# Patient Record
Sex: Female | Born: 1955 | Race: White | Hispanic: No | Marital: Single | Smoking: Current every day smoker
Health system: Southern US, Community
[De-identification: ages and names within clinical notes are randomized; demographics above are authoritative.]

## PROBLEM LIST (undated history)

## (undated) DIAGNOSIS — F419 Anxiety disorder, unspecified: Secondary | ICD-10-CM

## (undated) DIAGNOSIS — E079 Disorder of thyroid, unspecified: Secondary | ICD-10-CM

## (undated) DIAGNOSIS — J45909 Unspecified asthma, uncomplicated: Secondary | ICD-10-CM

## (undated) DIAGNOSIS — C4491 Basal cell carcinoma of skin, unspecified: Secondary | ICD-10-CM

## (undated) DIAGNOSIS — R32 Unspecified urinary incontinence: Secondary | ICD-10-CM

## (undated) DIAGNOSIS — K5790 Diverticulosis of intestine, part unspecified, without perforation or abscess without bleeding: Secondary | ICD-10-CM

## (undated) HISTORY — DX: Unspecified asthma, uncomplicated: J45.909

## (undated) HISTORY — DX: Unspecified urinary incontinence: R32

## (undated) HISTORY — DX: Anxiety disorder, unspecified: F41.9

## (undated) HISTORY — PX: TUBAL LIGATION: SHX77

## (undated) HISTORY — DX: Diverticulosis of intestine, part unspecified, without perforation or abscess without bleeding: K57.90

## (undated) HISTORY — DX: Basal cell carcinoma of skin, unspecified: C44.91

## (undated) HISTORY — DX: Disorder of thyroid, unspecified: E07.9

## (undated) HISTORY — PX: TUMOR REMOVAL: SHX12

---

## 2006-03-03 ENCOUNTER — Ambulatory Visit: Payer: Self-pay | Admitting: *Deleted

## 2006-08-30 ENCOUNTER — Ambulatory Visit: Payer: Self-pay | Admitting: *Deleted

## 2007-03-29 ENCOUNTER — Emergency Department: Payer: Self-pay | Admitting: Emergency Medicine

## 2007-04-10 ENCOUNTER — Other Ambulatory Visit: Payer: Self-pay

## 2007-04-10 ENCOUNTER — Emergency Department: Payer: Self-pay | Admitting: Internal Medicine

## 2007-08-16 ENCOUNTER — Ambulatory Visit: Payer: Self-pay

## 2008-12-25 IMAGING — CR DG CHEST 2V
1 series · 2 of 2 positions shown · non-contrast
Comparison: none

REASON FOR EXAM: Dyspnea
COMMENTS:

PROCEDURE:     DXR - DXR CHEST PA (OR AP) AND LATERAL  - March 29, 2007  [DATE]
RESULT:     The lung fields are clear. No pneumonia, pneumothorax or pleural
effusion is seen. The chest is hyperexpanded compatible with a history of
COPD.

[Series 1: view not recorded · 0.17mm/px · 2 of 2 slices shown]
[im 1/2]
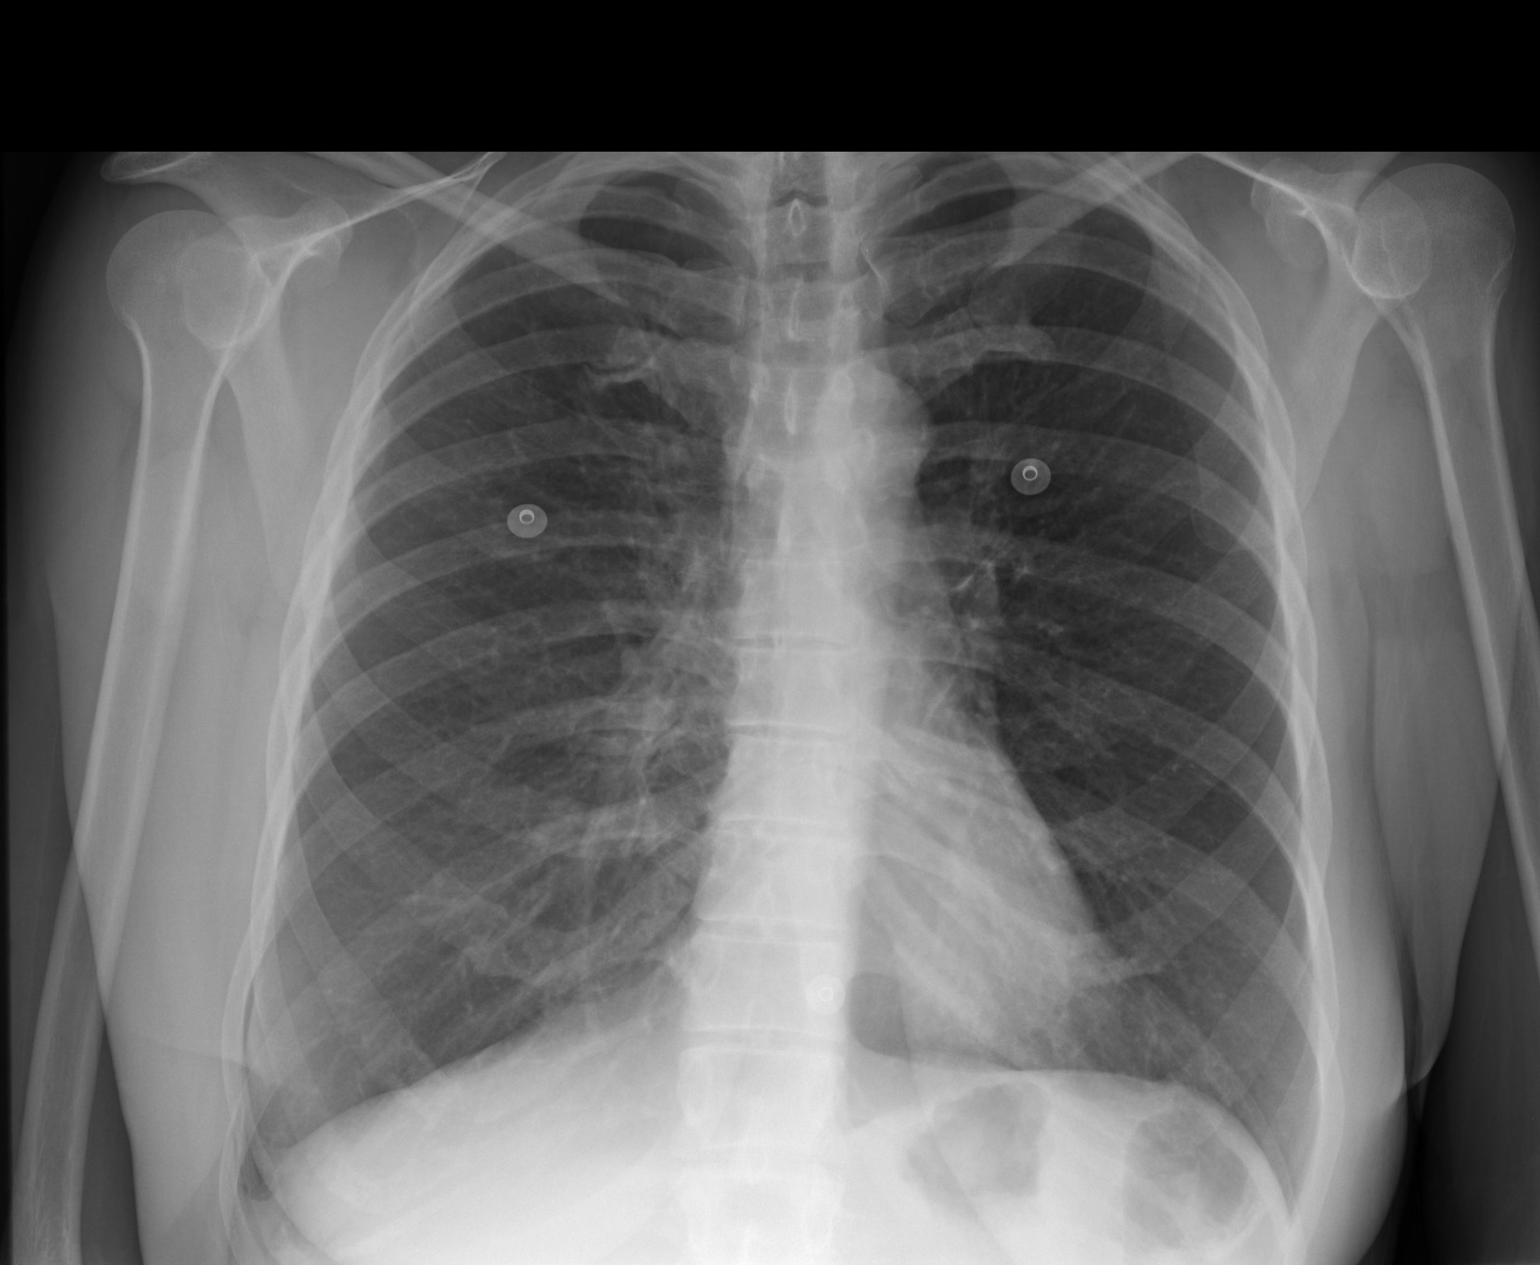
[im 2/2]
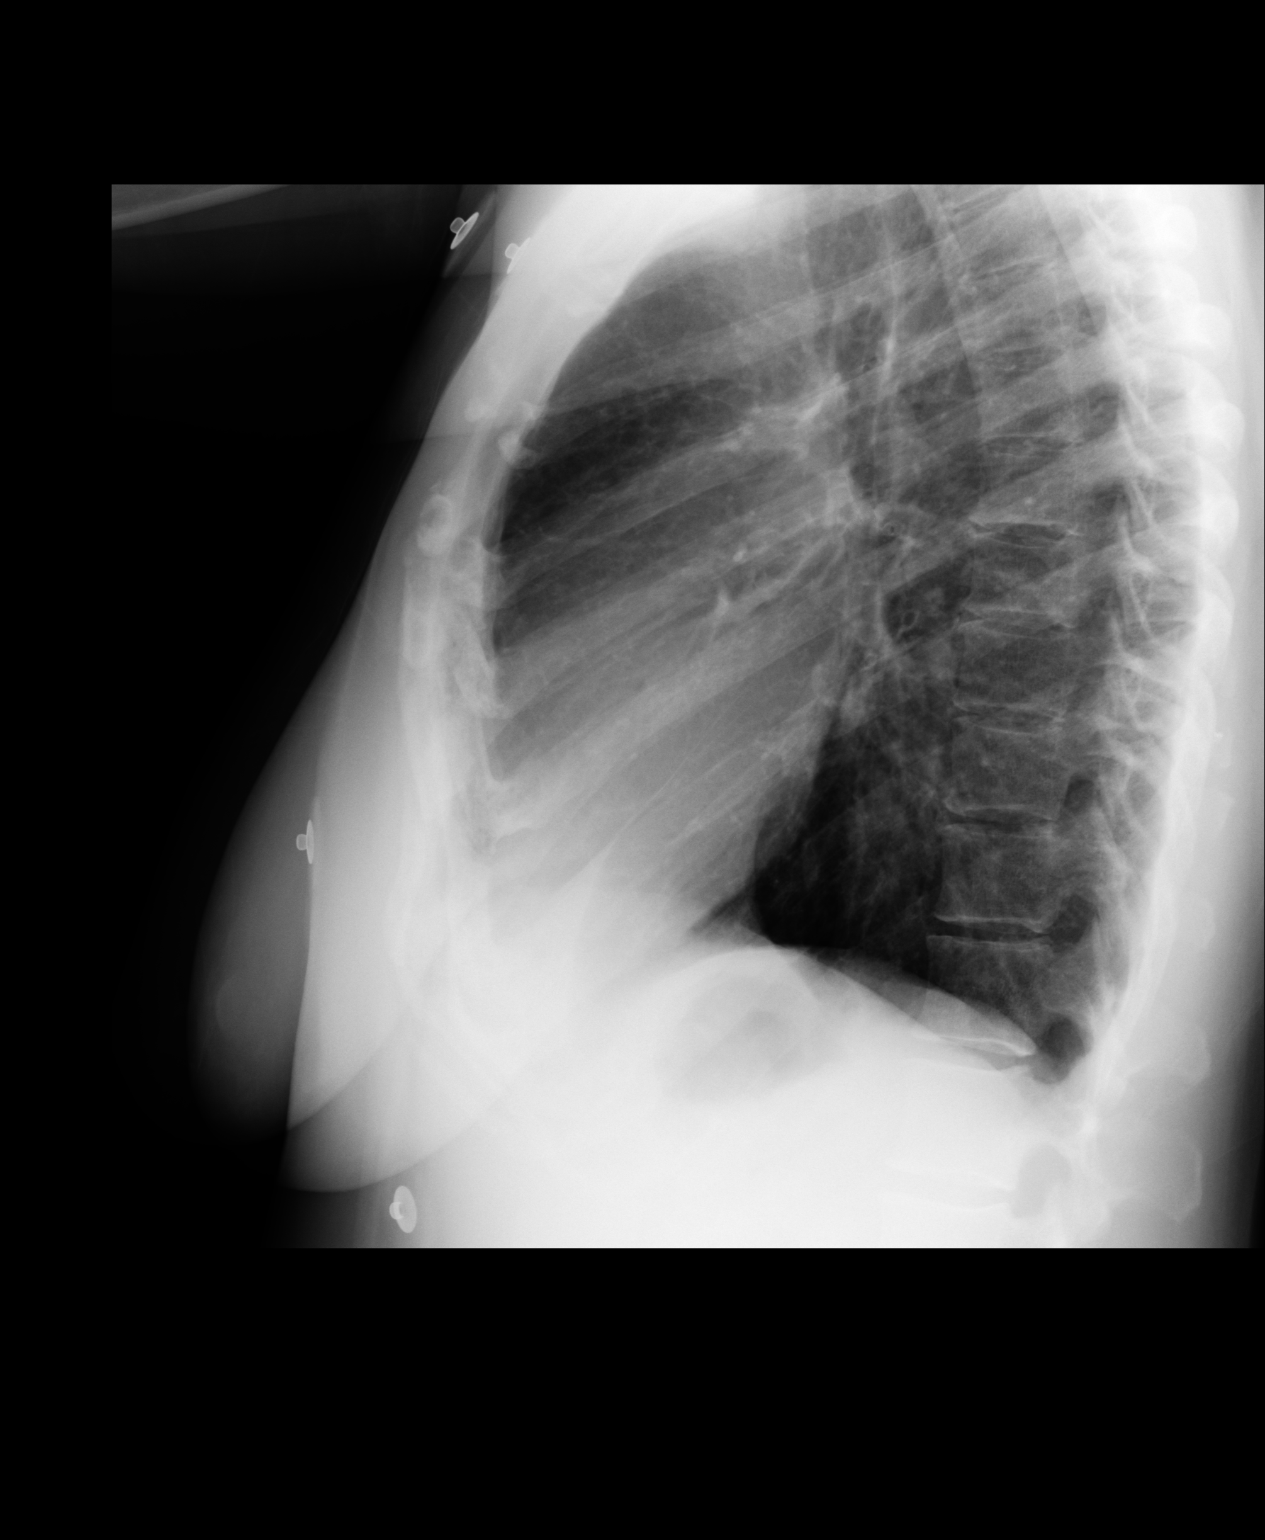

[2 of 2 positions shown; findings below may reference images not displayed]

IMPRESSION: 1.     The lung fields are clear.
2.     The chest is hyperexpanded consistent with a history of COPD.

## 2009-12-27 ENCOUNTER — Ambulatory Visit: Payer: Self-pay | Admitting: Family Medicine

## 2015-05-22 ENCOUNTER — Ambulatory Visit: Payer: Self-pay

## 2015-05-22 ENCOUNTER — Ambulatory Visit: Payer: Self-pay | Admitting: Internal Medicine

## 2015-05-22 DIAGNOSIS — J45909 Unspecified asthma, uncomplicated: Secondary | ICD-10-CM | POA: Insufficient documentation

## 2015-05-22 LAB — HEPATIC FUNCTION PANEL
ALK PHOS: 76 U/L (ref 25–125)
ALT: 17 U/L (ref 7–35)
AST: 18 U/L (ref 13–35)
BILIRUBIN, TOTAL: 0.3 mg/dL

## 2015-05-22 LAB — LIPID PANEL
Cholesterol: 179 mg/dL (ref 0–200)
HDL: 50 mg/dL (ref 35–70)
LDL Cholesterol: 97 mg/dL
TRIGLYCERIDES: 162 mg/dL — AB (ref 40–160)

## 2015-05-22 LAB — BASIC METABOLIC PANEL
BUN: 17 mg/dL (ref 4–21)
CREATININE: 0.9 mg/dL (ref 0.5–1.1)
POTASSIUM: 4.6 mmol/L (ref 3.4–5.3)
SODIUM: 137 mmol/L (ref 137–147)

## 2015-05-22 LAB — TSH: TSH: 3.26 u[IU]/mL (ref 0.41–5.90)

## 2015-06-11 DIAGNOSIS — Z720 Tobacco use: Secondary | ICD-10-CM | POA: Insufficient documentation

## 2015-06-12 ENCOUNTER — Other Ambulatory Visit: Payer: Self-pay

## 2015-06-12 LAB — CBC AND DIFFERENTIAL
HCT: 44 % (ref 36–46)
Hemoglobin: 14.4 g/dL (ref 12.0–16.0)
Neutrophils Absolute: 7 /uL
PLATELETS: 400 10*3/uL — AB (ref 150–399)
WBC: 12.1 10^3/mL

## 2015-06-12 LAB — BASIC METABOLIC PANEL: Glucose: 97 mg/dL

## 2015-06-12 LAB — HEMOGLOBIN A1C: HEMOGLOBIN A1C: 5.5

## 2015-06-26 ENCOUNTER — Ambulatory Visit: Payer: Self-pay | Admitting: Urology

## 2015-07-31 ENCOUNTER — Ambulatory Visit: Payer: Self-pay | Admitting: Urology

## 2015-12-10 DIAGNOSIS — J45909 Unspecified asthma, uncomplicated: Secondary | ICD-10-CM

## 2015-12-10 DIAGNOSIS — Z720 Tobacco use: Secondary | ICD-10-CM

## 2015-12-12 ENCOUNTER — Ambulatory Visit: Payer: Self-pay | Admitting: Nurse Practitioner

## 2015-12-12 VITALS — BP 118/70 | HR 95 | Temp 98.4°F | Ht 67.0 in | Wt 192.0 lb

## 2015-12-12 DIAGNOSIS — E782 Mixed hyperlipidemia: Secondary | ICD-10-CM

## 2015-12-12 DIAGNOSIS — D473 Essential (hemorrhagic) thrombocythemia: Secondary | ICD-10-CM

## 2015-12-12 DIAGNOSIS — D75839 Thrombocytosis, unspecified: Secondary | ICD-10-CM

## 2015-12-12 DIAGNOSIS — R7309 Other abnormal glucose: Secondary | ICD-10-CM

## 2015-12-12 DIAGNOSIS — K047 Periapical abscess without sinus: Secondary | ICD-10-CM

## 2015-12-12 DIAGNOSIS — R5383 Other fatigue: Secondary | ICD-10-CM

## 2015-12-12 MED ORDER — DOXYCYCLINE HYCLATE 50 MG PO CAPS
100.0000 mg | ORAL_CAPSULE | Freq: Two times a day (BID) | ORAL | Status: AC
Start: 2015-12-12 — End: 2015-12-26

## 2015-12-12 NOTE — Progress Notes (Signed)
   Subjective:    Patient ID: Sarah Weber, female    DOB: Mar 14, 1956, 60 y.o.   MRN: [id number]  HPI Here today to [facility], last seen in 05/2015, history of thrombocytosis, elevated wbc's, dental abscess, borderline diabetes. Today with very pain L lower jaw abscessed tooth and swollen jaw.    Has not been to clinic regularly because of transportation issues.    Has not been to a dentist since 1995.       Review of Systems  Constitutional: Negative.   HENT: Positive for dental problem. Negative for sinus pressure, tinnitus, trouble swallowing and voice change.   Eyes: Negative.   Respiratory: Positive for cough and wheezing.   Cardiovascular: Negative for chest pain and palpitations.  Gastrointestinal: Negative for diarrhea.  Genitourinary: Negative for pelvic pain.  Musculoskeletal: Negative for neck stiffness.       Objective:   Physical Exam  Constitutional: She is oriented to person, place, and time. She appears well-developed and well-nourished.  HENT:  Head: Normocephalic.  L lower molar with surrounding gum edema and erythema, swelling of L lower face/jaw  Eyes: Pupils are equal, round, and reactive to light.  Neck: No thyromegaly present.  Palpable submandibular adenopathy, L>R  Cardiovascular: Normal rate and regular rhythm.   Pulmonary/Chest: Effort normal. She has wheezes. She has no rales.  Lymphadenopathy:    She has no cervical adenopathy.  Neurological: She is alert and oriented to person, place, and time.  Skin: Skin is warm and dry.          Assessment & Plan:  Sarah Weber was seen today for dental pain.  Diagnoses and all orders for this visit:  Dental abscess  Mixed hyperlipidemia -     COMPLETE METABOLIC PANEL WITH GFR -     Lipid Profile  Blood glucose abnormal -     COMPLETE METABOLIC PANEL WITH GFR -     HgB A1c  Thrombocytosis (HCC) -     CBC w/Diff  Other fatigue -     COMPLETE METABOLIC PANEL WITH GFR -      TSH  Other orders -     doxycycline (VIBRAMYCIN) 50 MG capsule; Take 2 capsules (100 mg total) by mouth 2 (two) times daily.  will implement doxycycline for dental abscess for full 14 days Pt needs dental referral asap, likely will need extraction of L lower molar  Needs eye exam  Would like to return in 2-3 weeks to review all labs and address as indicated.

## 2015-12-13 LAB — CBC WITH DIFFERENTIAL/PLATELET
BASOS: 1 %
Basophils Absolute: 0.1 10*3/uL (ref 0.0–0.2)
EOS (ABSOLUTE): 0.1 10*3/uL (ref 0.0–0.4)
EOS: 1 %
HEMATOCRIT: 42.1 % (ref 34.0–46.6)
HEMOGLOBIN: 14.1 g/dL (ref 11.1–15.9)
Immature Grans (Abs): 0 10*3/uL (ref 0.0–0.1)
Immature Granulocytes: 0 %
LYMPHS ABS: 3.5 10*3/uL — AB (ref 0.7–3.1)
Lymphs: 33 %
MCH: 31.1 pg (ref 26.6–33.0)
MCHC: 33.5 g/dL (ref 31.5–35.7)
MCV: 93 fL (ref 79–97)
MONOS ABS: 0.7 10*3/uL (ref 0.1–0.9)
Monocytes: 7 %
NEUTROS PCT: 58 %
Neutrophils Absolute: 6.4 10*3/uL (ref 1.4–7.0)
Platelets: 388 10*3/uL — ABNORMAL HIGH (ref 150–379)
RBC: 4.54 x10E6/uL (ref 3.77–5.28)
RDW: 13.6 % (ref 12.3–15.4)
WBC: 10.8 10*3/uL (ref 3.4–10.8)

## 2015-12-13 LAB — LIPID PANEL
CHOL/HDL RATIO: 3.2 ratio (ref 0.0–4.4)
Cholesterol, Total: 178 mg/dL (ref 100–199)
HDL: 56 mg/dL (ref >39)
LDL CALC: 92 mg/dL (ref 0–99)
Triglycerides: 149 mg/dL (ref 0–149)
VLDL CHOLESTEROL CAL: 30 mg/dL (ref 5–40)

## 2015-12-13 LAB — TSH: TSH: 3.9 u[IU]/mL (ref 0.450–4.500)

## 2015-12-13 LAB — HEMOGLOBIN A1C
Est. average glucose Bld gHb Est-mCnc: 111 mg/dL
Hgb A1c MFr Bld: 5.5 % (ref 4.8–5.6)

## 2016-01-02 ENCOUNTER — Ambulatory Visit: Payer: Self-pay | Admitting: Urology

## 2016-01-02 VITALS — BP 114/72 | HR 76 | Resp 16 | Wt 194.0 lb

## 2016-01-02 DIAGNOSIS — Z720 Tobacco use: Secondary | ICD-10-CM

## 2016-01-02 DIAGNOSIS — J45909 Unspecified asthma, uncomplicated: Secondary | ICD-10-CM

## 2016-01-02 NOTE — Progress Notes (Signed)
  Patient: Sarah Weber Female    DOB: 1956/04/23   60 y.o.   MRN: [id number] Visit Date: 01/02/2016  Today's Provider: [facility]   No chief complaint on file.  Subjective:    HPI Patient is her to review labs.  Platelets slighty elevated, but down from last time.  Other labs are normal.  CMP not done.  Using an inhaler once daily.      Allergies  Allergen Reactions  . Amoxil [Amoxicillin] Itching    Mouth itching  . Erythromycin     Upset Stomach   Previous Medications   ALBUTEROL (VENTOLIN HFA) 108 (90 BASE) MCG/ACT INHALER    Inhale 1 puff into the lungs every 6 (six) hours as needed for wheezing or shortness of breath (Inhale 1 to 2 puffs every 6 hours as needed.).   CALCIUM CARBONATE ANTACID (ANTACID E-X PO)    Take by mouth as needed.    Review of Systems  Constitutional: Negative.   HENT: Negative.   Eyes: Negative.   Respiratory: Positive for cough.   Cardiovascular: Negative.   Gastrointestinal: Negative.   Endocrine: Negative.   Genitourinary: Negative.   Musculoskeletal: Negative.   Skin: Negative.   Allergic/Immunologic: Negative.   Neurological: Negative.   Hematological: Negative.   Psychiatric/Behavioral: Negative.     Social History  Substance Use Topics  . Smoking status: Current Every Day Smoker -- 1.00 packs/day for 40 years  . Smokeless tobacco: Not on file  . Alcohol Use: 3.6 oz/week    6 Standard drinks or equivalent per week   Objective:   BP 114/72 mmHg  Pulse 76  Resp 16  Wt 194 lb (87.998 kg)  Physical Exam  Constitutional: She is oriented to person, place, and time. She appears well-developed and well-nourished.  HENT:  Head: Normocephalic and atraumatic.  Eyes: Conjunctivae and EOM are normal. Pupils are equal, round, and reactive to light.  Neck: Normal range of motion.  Cardiovascular: Normal rate and regular rhythm.   Pulmonary/Chest: Effort normal and breath sounds normal.  Abdominal: Soft. Bowel sounds are  normal.  Musculoskeletal: Normal range of motion.  Neurological: She is alert and oriented to person, place, and time.  Skin: Skin is warm and dry.  Psychiatric: She has a normal mood and affect. Her behavior is normal. Judgment and thought content normal.        Assessment & Plan:     Dental abscess  -patient will be seen at the dental clinic  Mixed hyperlipidemia - Lipid Profile-normal  Blood glucose abnormal - COMPLETE METABOLIC PANEL WITH GFR-not done - HgB A1c-normal  Thrombocytosis (HCC) - CBC w/Diff-wnl  Other fatigue - COMPLETE METABOLIC PANEL WITH GFR-not done - TSH-normal      [facility]

## 2016-06-05 ENCOUNTER — Other Ambulatory Visit: Payer: Self-pay | Admitting: Internal Medicine

## 2016-06-05 DIAGNOSIS — J45909 Unspecified asthma, uncomplicated: Secondary | ICD-10-CM

## 2016-08-06 ENCOUNTER — Telehealth: Payer: Self-pay | Admitting: Pharmacist

## 2016-08-06 NOTE — Telephone Encounter (Signed)
Ventolin PAP submitted to manufacturer today.

## 2016-08-18 ENCOUNTER — Telehealth: Payer: Self-pay

## 2016-08-18 NOTE — Telephone Encounter (Signed)
PT called in with complaint of a abscess on her tooth. She wanted some antibiotics. I explained we had nothing available this week but I would try and get her in to the dental clinic because she has already paid the $20. I did get her in her appt is at 1030. Pt verbalized understanding and will be there.

## 2016-12-17 ENCOUNTER — Telehealth: Payer: Self-pay | Admitting: Pharmacist

## 2016-12-17 NOTE — Telephone Encounter (Signed)
12/17/16 Placed refill online with [facility] for Ventolin HFA, to ship 12/30/16.

## 2018-01-03 ENCOUNTER — Ambulatory Visit
Admission: RE | Admit: 2018-01-03 | Discharge: 2018-01-03 | Disposition: A | Discharge status: Home / Self Care | Payer: Self-pay | Source: Ambulatory Visit | Attending: Oncology | Admitting: Oncology

## 2018-01-03 ENCOUNTER — Encounter (INDEPENDENT_AMBULATORY_CARE_PROVIDER_SITE_OTHER): Payer: Self-pay

## 2018-01-03 ENCOUNTER — Ambulatory Visit: Payer: Self-pay | Attending: Oncology | Admitting: *Deleted

## 2018-01-03 VITALS — BP 138/83 | HR 77 | Temp 97.7°F | Ht 68.0 in | Wt 192.0 lb

## 2018-01-03 DIAGNOSIS — Z0000 Encounter for general adult medical examination without abnormal findings: Secondary | ICD-10-CM

## 2018-01-03 NOTE — Patient Instructions (Signed)
Gave patient hand-out, Women Staying Healthy, Active and Well from [facility], with education on breast health, pap smears, heart and colon health. 

## 2018-01-03 NOTE — Progress Notes (Signed)
  Subjective:     Patient ID: Sarah Weber, female   DOB: June 29, 1956, 62 y.o.   MRN: [id number]  HPI   Review of Systems     Objective:   Physical Exam  Pulmonary/Chest: Right breast exhibits tenderness. Right breast exhibits no inverted nipple, no mass, no nipple discharge and no skin change. Left breast exhibits no inverted nipple, no mass, no nipple discharge, no skin change and no tenderness.         Assessment:     62 year old White female returns to [facility] for clinical breast exam and mammogram.  Refuses pap smear, although last pap was in 2009.  Stressed importance of screening.  Patient states she is here because she felt a mass in her right axilla in January.  States she has a history of "lipoma's" and thinks that is what she is feeling.  On clinical breast exam there is no dominant mass, skin changes, nipple discharge or lymphadenopathy.  The patient points to the area of concern and I still cannot palpate a mass.  With the patient in supine position I can palpate a slight increase in tissue in the right axilla, but no mass like area.  Taught patient self breast awareness.  Discussed option of diagnostic work-up for her concerns, or return in 2 months for a repeat clinical breast exam.  States she would go forward with a screening mammogram and return in 2 months for another clinical breast exam.  Patient has been screened for eligibility.  She does not have any insurance, Medicare or Medicaid.  She also meets financial eligibility.  Hand-out given on the Affordable Care Act.    Plan:     Screening mammogram ordered.  Jeanella Anton to schedule patient to return in 2 months for repeat clinical breast exam.  Will follow-up per [facility] protocol.

## 2018-01-04 ENCOUNTER — Encounter: Payer: Self-pay | Admitting: *Deleted

## 2018-01-04 NOTE — Progress Notes (Signed)
Letter mailed from the [facility] to inform patient of her normal mammogram results.  Patient is to follow-up with annual screening in one year.  HSIS to [place].  Patient is scheduled to return for repeat clinical breast exam on 02/21/18 @ 11:00.

## 2018-01-10 ENCOUNTER — Emergency Department
Admission: EM | Admit: 2018-01-10 | Discharge: 2018-01-10 | Disposition: A | Discharge status: Home / Self Care | Payer: Self-pay | Attending: Emergency Medicine | Admitting: Emergency Medicine

## 2018-01-10 ENCOUNTER — Other Ambulatory Visit: Payer: Self-pay

## 2018-01-10 ENCOUNTER — Emergency Department: Payer: Self-pay

## 2018-01-10 DIAGNOSIS — F17210 Nicotine dependence, cigarettes, uncomplicated: Secondary | ICD-10-CM | POA: Insufficient documentation

## 2018-01-10 DIAGNOSIS — R531 Weakness: Secondary | ICD-10-CM | POA: Insufficient documentation

## 2018-01-10 DIAGNOSIS — J45909 Unspecified asthma, uncomplicated: Secondary | ICD-10-CM

## 2018-01-10 DIAGNOSIS — R55 Syncope and collapse: Secondary | ICD-10-CM

## 2018-01-10 DIAGNOSIS — R42 Dizziness and giddiness: Secondary | ICD-10-CM

## 2018-01-10 LAB — URINALYSIS, COMPLETE (UACMP) WITH MICROSCOPIC
BACTERIA UA: NONE SEEN
BILIRUBIN URINE: NEGATIVE
GLUCOSE, UA: NEGATIVE mg/dL
Ketones, ur: NEGATIVE mg/dL
LEUKOCYTES UA: NEGATIVE
NITRITE: NEGATIVE
PH: 6 (ref 5.0–8.0)
Protein, ur: NEGATIVE mg/dL
SPECIFIC GRAVITY, URINE: 1.003 — AB (ref 1.005–1.030)
WBC UA: NONE SEEN WBC/hpf (ref 0–5)

## 2018-01-10 LAB — CBC WITH DIFFERENTIAL/PLATELET
BASOS ABS: 0.1 10*3/uL (ref 0–0.1)
Basophils Relative: 1 %
EOS ABS: 0.1 10*3/uL (ref 0–0.7)
Eosinophils Relative: 1 %
HCT: 44.2 % (ref 35.0–47.0)
HEMOGLOBIN: 15.2 g/dL (ref 12.0–16.0)
Lymphocytes Relative: 23 %
Lymphs Abs: 2.4 10*3/uL (ref 1.0–3.6)
MCH: 32.4 pg (ref 26.0–34.0)
MCHC: 34.4 g/dL (ref 32.0–36.0)
MCV: 94.2 fL (ref 80.0–100.0)
Monocytes Absolute: 0.8 10*3/uL (ref 0.2–0.9)
Monocytes Relative: 8 %
NEUTROS PCT: 67 %
Neutro Abs: 6.9 10*3/uL — ABNORMAL HIGH (ref 1.4–6.5)
PLATELETS: 325 10*3/uL (ref 150–440)
RBC: 4.69 MIL/uL (ref 3.80–5.20)
RDW: 13.1 % (ref 11.5–14.5)
WBC: 10.4 10*3/uL (ref 3.6–11.0)

## 2018-01-10 LAB — COMPREHENSIVE METABOLIC PANEL
ALK PHOS: 58 U/L (ref 38–126)
ALT: 26 U/L (ref 14–54)
ANION GAP: 7 (ref 5–15)
AST: 23 U/L (ref 15–41)
Albumin: 4.2 g/dL (ref 3.5–5.0)
BUN: 13 mg/dL (ref 6–20)
CALCIUM: 9.1 mg/dL (ref 8.9–10.3)
CHLORIDE: 101 mmol/L (ref 101–111)
CO2: 25 mmol/L (ref 22–32)
Creatinine, Ser: 0.81 mg/dL (ref 0.44–1.00)
Glucose, Bld: 121 mg/dL — ABNORMAL HIGH (ref 65–99)
Potassium: 4.1 mmol/L (ref 3.5–5.1)
SODIUM: 133 mmol/L — AB (ref 135–145)
Total Bilirubin: 0.4 mg/dL (ref 0.3–1.2)
Total Protein: 7.9 g/dL (ref 6.5–8.1)

## 2018-01-10 LAB — TROPONIN I

## 2018-01-10 LAB — TSH: TSH: 3.071 u[IU]/mL (ref 0.350–4.500)

## 2018-01-10 LAB — CK: CK TOTAL: 79 U/L (ref 38–234)

## 2018-01-10 LAB — T4, FREE: Free T4: 0.74 ng/dL — ABNORMAL LOW (ref 0.82–1.77)

## 2018-01-10 MED ORDER — ALBUTEROL SULFATE HFA 108 (90 BASE) MCG/ACT IN AERS: 2.0000 | INHALATION_SPRAY | Freq: Four times a day (QID) | RESPIRATORY_TRACT | 0 refills | Status: DC | PRN

## 2018-01-10 MED ORDER — GADOBENATE DIMEGLUMINE 529 MG/ML IV SOLN
20.0000 mL | Freq: Once | INTRAVENOUS | Status: AC | PRN
  Administered 2018-01-10: 18 mL via INTRAVENOUS

## 2018-01-10 NOTE — Discharge Instructions (Signed)
It was a pleasure to take care of you today, and thank you for coming to our emergency department.  If you have any questions or concerns before leaving please ask the nurse to grab me and I'm more than happy to go through your aftercare instructions again.  If you were prescribed any opioid pain medication today such as Norco, Vicodin, Percocet, morphine, hydrocodone, or oxycodone please make sure you do not drive when you are taking this medication as it can alter your ability to drive safely.  If you have any concerns once you are home that you are not improving or are in fact getting worse before you can make it to your follow-up appointment, please do not hesitate to call 911 and come back for further evaluation.  Darel Hong, MD  Results for orders placed or performed during the hospital encounter of 01/10/18  Comprehensive metabolic panel  Result Value Ref Range   Sodium 133 (L) 135 - 145 mmol/L   Potassium 4.1 3.5 - 5.1 mmol/L   Chloride 101 101 - 111 mmol/L   CO2 25 22 - 32 mmol/L   Glucose, Bld 121 (H) 65 - 99 mg/dL   BUN 13 6 - 20 mg/dL   Creatinine, Ser 0.81 0.44 - 1.00 mg/dL   Calcium 9.1 8.9 - 10.3 mg/dL   Total Protein 7.9 6.5 - 8.1 g/dL   Albumin 4.2 3.5 - 5.0 g/dL   AST 23 15 - 41 U/L   ALT 26 14 - 54 U/L   Alkaline Phosphatase 58 38 - 126 U/L   Total Bilirubin 0.4 0.3 - 1.2 mg/dL   GFR calc non Af Amer >60 >60 mL/min   GFR calc Af Amer >60 >60 mL/min   Anion gap 7 5 - 15  Troponin I  Result Value Ref Range   Troponin I <0.03 <0.03 ng/mL  CBC with Differential  Result Value Ref Range   WBC 10.4 3.6 - 11.0 K/uL   RBC 4.69 3.80 - 5.20 MIL/uL   Hemoglobin 15.2 12.0 - 16.0 g/dL   HCT 44.2 35.0 - 47.0 %   MCV 94.2 80.0 - 100.0 fL   MCH 32.4 26.0 - 34.0 pg   MCHC 34.4 32.0 - 36.0 g/dL   RDW 13.1 11.5 - 14.5 %   Platelets 325 150 - 440 K/uL   Neutrophils Relative % 67 %   Neutro Abs 6.9 (H) 1.4 - 6.5 K/uL   Lymphocytes Relative 23 %   Lymphs Abs 2.4 1.0 - 3.6  K/uL   Monocytes Relative 8 %   Monocytes Absolute 0.8 0.2 - 0.9 K/uL   Eosinophils Relative 1 %   Eosinophils Absolute 0.1 0 - 0.7 K/uL   Basophils Relative 1 %   Basophils Absolute 0.1 0 - 0.1 K/uL  Urinalysis, Complete w Microscopic  Result Value Ref Range   Color, Urine STRAW (A) YELLOW   APPearance CLEAR (A) CLEAR   Specific Gravity, Urine 1.003 (L) 1.005 - 1.030   pH 6.0 5.0 - 8.0   Glucose, UA NEGATIVE NEGATIVE mg/dL   Hgb urine dipstick SMALL (A) NEGATIVE   Bilirubin Urine NEGATIVE NEGATIVE   Ketones, ur NEGATIVE NEGATIVE mg/dL   Protein, ur NEGATIVE NEGATIVE mg/dL   Nitrite NEGATIVE NEGATIVE   Leukocytes, UA NEGATIVE NEGATIVE   WBC, UA NONE SEEN 0 - 5 WBC/hpf   Bacteria, UA NONE SEEN NONE SEEN   Squamous Epithelial / LPF 0-5 0 - 5  CK  Result Value Ref Range  Total CK 79 38 - 234 U/L  TSH  Result Value Ref Range   TSH 3.071 0.350 - 4.500 uIU/mL  T4, free  Result Value Ref Range   Free T4 0.74 (L) 0.82 - 1.77 ng/dL   Mr Jeri Cos And Wo Contrast  Result Date: 01/10/2018 CLINICAL DATA:  Dizziness and weakness. EXAM: MRI HEAD WITHOUT AND WITH CONTRAST TECHNIQUE: Multiplanar, multiecho pulse sequences of the brain and surrounding structures were obtained without and with intravenous contrast. CONTRAST:  7mL MULTIHANCE GADOBENATE DIMEGLUMINE 529 MG/ML IV SOLN COMPARISON:  None. FINDINGS: Brain: No evidence for acute infarction, hemorrhage, mass lesion, hydrocephalus, or extra-axial fluid. Normal cerebral volume. No significant white matter disease. Post infusion, no abnormal enhancement of the brain or meninges. Vascular: Flow voids are maintained throughout the carotid, basilar, and vertebral arteries. There are no areas of chronic hemorrhage. Skull and upper cervical spine: Unremarkable visualized calvarium, skullbase, and cervical vertebrae. Pituitary, pineal, cerebellar tonsils unremarkable. No upper cervical cord lesions. Sinuses/Orbits: No orbital masses or proptosis.  Globes appear symmetric. Sinuses appear well aerated, without evidence for air-fluid level. Other: Mastoid air cells are clear. Scalp soft tissues are unremarkable. IMPRESSION: Negative exam. Electronically Signed   By: Staci Righter M.D.   On: 01/10/2018 14:18   Dg Chest Port 1 View  Result Date: 01/10/2018 CLINICAL DATA:  Dizziness and weakness. EXAM: PORTABLE CHEST 1 VIEW COMPARISON:  04/10/2007 FINDINGS: Cardiomediastinal silhouette is normal. Mediastinal contours appear intact. Calcific atherosclerotic disease of the aorta. There is no evidence of focal airspace consolidation, pleural effusion or pneumothorax. Osseous structures are without acute abnormality. Soft tissues are grossly normal. IMPRESSION: No active disease. Calcific atherosclerotic disease of the aorta. Electronically Signed   By: Fidela Salisbury M.D.   On: 01/10/2018 11:44   Ms Digital Screening Tomo Bilateral  Result Date: 01/03/2018 CLINICAL DATA:  Screening. EXAM: DIGITAL SCREENING BILATERAL MAMMOGRAM WITH TOMO AND CAD COMPARISON:  None. ACR Breast Density Category b: There are scattered areas of fibroglandular density. FINDINGS: There are no findings suspicious for malignancy. Images were processed with CAD. IMPRESSION: No mammographic evidence of malignancy. A result letter of this screening mammogram will be mailed directly to the patient. RECOMMENDATION: Screening mammogram in one year. (Code:SM-B-01Y) BI-RADS CATEGORY  1: Negative. Electronically Signed   By: Claudie Revering M.D.   On: 01/03/2018 17:14

## 2018-01-10 NOTE — ED Triage Notes (Signed)
Pt arrived via [facility] from work at [employer] with c/o dizziness and weakness. Pt states that she was starting to feel very dizzy and weak this morning while she was at work. Pt states that she has been having periodic instances of weakness and dizziness that would go away for the last few months. Pt states she has coughing spells that cause her to cough up blood. Pt AxOx4. No LOC, N/V/D.

## 2018-01-10 NOTE — ED Provider Notes (Signed)
[facility] Emergency Department Provider Note  ____________________________________________   First MD Initiated Contact with Patient 01/10/18 1128     (approximate)  I have reviewed the triage vital signs and the nursing notes.   HISTORY  Chief Complaint Weakness and Dizziness   HPI Sarah Weber is a 62 y.o. female who comes to the emergency department via EMS with multiple issues.  She says that today while at work she began to feel lightheaded, dizzy, and generally weak.  Associated with some nausea.  Her symptoms seem to begin about 3 months ago and began with bilateral lower extremity numbness, tingling, and weakness.  She has been intermittently "clumsy" and has had difficulty ambulating.  She has intermittent tingling in her arms and has had intermittent blurred vision.  No particular headache.  She thought she might be diabetic because of recent increase in thirst.  She has not sought medical care until today.  She denies trauma.  She denies chest pain or shortness of breath.  Her dizziness was insidious onset mostly constant.  She does not describe vertigo.  Nothing seems to make her symptoms better or worse.  Past Medical History:  Diagnosis Date  . Asthma   . BCC (basal cell carcinoma)   . Thyroid disease     Patient Active Problem List   Diagnosis Date Noted  . Tobacco abuse 06/11/2015  . Asthma 05/22/2015    Past Surgical History:  Procedure Laterality Date  . CESAREAN SECTION     x 2  . TUBAL LIGATION    . TUMOR REMOVAL     Two benign tumors removed from lower back    Prior to Admission medications   Medication Sig Start Date End Date Taking? Authorizing Provider  albuterol (PROVENTIL HFA;VENTOLIN HFA) 108 (90 Base) MCG/ACT inhaler Inhale 2 puffs into the lungs every 6 (six) hours as needed for wheezing or shortness of breath. 01/10/18   Darel Hong, MD  Calcium Carbonate Antacid (ANTACID E-X PO) Take by mouth as needed.     [provider]    Allergies Amoxil [amoxicillin] and Erythromycin  Family History  Problem Relation Age of Onset  . Parkinson's disease Mother   . Kidney disease Mother   . Hypertension Mother   . Cancer Father        Pancreatic  . Diabetes Sister   . Diabetes Sister   . Diabetes Maternal Grandmother   . Diabetes Paternal Grandmother   . Breast cancer Paternal Grandmother 34    Social History Social History   Tobacco Use  . Smoking status: Current Every Day Smoker    Packs/day: 1.00    Years: 40.00    Pack years: 40.00  Substance Use Topics  . Alcohol use: Yes    Alcohol/week: 3.6 oz    Types: 6 Standard drinks or equivalent per week  . Drug use: No    Review of Systems Constitutional: No fever/chills Eyes: Positive for visual changes. ENT: No sore throat. Cardiovascular: Denies chest pain. Respiratory: Denies shortness of breath. Gastrointestinal: No abdominal pain.  Positive for nausea, no vomiting.  No diarrhea.  No constipation. Genitourinary: Negative for dysuria. Musculoskeletal: Negative for back pain. Skin: Negative for rash. Neurological: Positive for bilateral lower extremity weakness   ____________________________________________   PHYSICAL EXAM:  VITAL SIGNS: ED Triage Vitals  Enc Vitals Group     BP 01/10/18 1058 (!) 125/113     Pulse Rate 01/10/18 1058 83     Resp 01/10/18  1058 (!) 24     Temp 01/10/18 1058 97.9 F (36.6 C)     Temp Source 01/10/18 1058 Oral     SpO2 01/10/18 1058 97 %     Weight 01/10/18 1101 192 lb (87.1 kg)     Height 01/10/18 1101 5\' 8"  (1.727 m)     Head Circumference --      Peak Flow --      Pain Score 01/10/18 1101 0     Pain Loc --      Pain Edu? --      Excl. in [place]? --     Constitutional: Alert and oriented x4 pleasant cooperative speaks in full clear sentences no diaphoresis Eyes: PERRL EOMI. midrange and brisk Head: Atraumatic. Nose: No congestion/rhinnorhea. Mouth/Throat: No  trismus Neck: No stridor.   Cardiovascular: Normal rate, regular rhythm. Grossly normal heart sounds.  Good peripheral circulation. Respiratory: Slightly increased respiratory effort.  No retractions. Lungs CTAB and moving good air Gastrointestinal: Soft nontender Musculoskeletal: No lower extremity edema   Neurologic:  Normal speech and language. No gross focal neurologic deficits are appreciated. Skin:  Skin is warm, dry and intact. No rash noted. Psychiatric: Mood and affect are normal. Speech and behavior are normal.    ____________________________________________   DIFFERENTIAL includes but not limited to  Multiple sclerosis, stroke, TIA, intracerebral hemorrhage, arrhythmia, seizure ____________________________________________   LABS (all labs ordered are listed, but only abnormal results are displayed)  Labs Reviewed  COMPREHENSIVE METABOLIC PANEL - Abnormal; Notable for the following components:      Result Value   Sodium 133 (*)    Glucose, Bld 121 (*)    All other components within normal limits  CBC WITH DIFFERENTIAL/PLATELET - Abnormal; Notable for the following components:   Neutro Abs 6.9 (*)    All other components within normal limits  URINALYSIS, COMPLETE (UACMP) WITH MICROSCOPIC - Abnormal; Notable for the following components:   Color, Urine STRAW (*)    APPearance CLEAR (*)    Specific Gravity, Urine 1.003 (*)    Hgb urine dipstick SMALL (*)    All other components within normal limits  T4, FREE - Abnormal; Notable for the following components:   Free T4 0.74 (*)    All other components within normal limits  TROPONIN I  CK  TSH    Lab work reviewed by me with no acute disease __________________________________________  EKG  ED ECG REPORT I, Darel Hong, the attending physician, personally viewed and interpreted this ECG.  Date: 01/10/2018 EKG Time:  Rate: 87 Rhythm: normal sinus rhythm QRS Axis: normal Intervals: normal ST/T Wave  abnormalities: normal Narrative Interpretation: no evidence of acute ischemia  ____________________________________________  RADIOLOGY  Chest x-ray reviewed by me with no acute disease MRI of the brain with and without contrast reviewed by me with no acute disease ____________________________________________   PROCEDURES  Procedure(s) performed: no  Procedures  Critical Care performed: no  Observation: no ____________________________________________   INITIAL IMPRESSION / ASSESSMENT AND PLAN / ED COURSE  Pertinent labs & imaging results that were available during my care of the patient were reviewed by me and considered in my medical decision making (see chart for details).  The patient arrives relatively well-appearing with normal strength exam although she does report intermittent weakness in her legs along with blurred vision and a near syncopal event today.  This does raise concern for possible multiple sclerosis amongst other disorders.  Defer CT scan of the brain and will go to  an MRI with and without contrast instead at this time.  Fortunately the patient's thyroid studies and MRI are unremarkable.  I had a lengthy discussion with the patient regarding the diagnostic uncertainty and the importance of reestablishing care with primary care.  She is able to ambulate at this time without difficulty.  She is discharged home in improved condition verbalizes understanding agree with plan.      ____________________________________________   FINAL CLINICAL IMPRESSION(S) / ED DIAGNOSES  Final diagnoses:  Lightheaded  Near syncope      NEW MEDICATIONS STARTED DURING THIS VISIT:  Discharge Medication List as of 01/10/2018  2:24 PM       Note:  This document was prepared using Dragon voice recognition software and may include unintentional dictation errors.     Darel Hong, MD 01/10/18 2123

## 2018-02-21 ENCOUNTER — Ambulatory Visit: Payer: Self-pay | Attending: Oncology

## 2019-11-12 ENCOUNTER — Ambulatory Visit: Payer: Self-pay

## 2019-11-20 ENCOUNTER — Other Ambulatory Visit: Payer: Self-pay | Admitting: Internal Medicine

## 2019-11-20 DIAGNOSIS — Z1231 Encounter for screening mammogram for malignant neoplasm of breast: Secondary | ICD-10-CM

## 2021-10-17 ENCOUNTER — Encounter: Payer: Self-pay | Admitting: Nurse Practitioner

## 2021-10-17 ENCOUNTER — Other Ambulatory Visit: Payer: Self-pay

## 2021-10-17 ENCOUNTER — Ambulatory Visit (INDEPENDENT_AMBULATORY_CARE_PROVIDER_SITE_OTHER): Payer: Managed Care, Other (non HMO) | Admitting: Nurse Practitioner

## 2021-10-17 VITALS — BP 152/87 | HR 90 | Ht 67.5 in | Wt 165.0 lb

## 2021-10-17 DIAGNOSIS — J45909 Unspecified asthma, uncomplicated: Secondary | ICD-10-CM | POA: Diagnosis not present

## 2021-10-17 DIAGNOSIS — Z720 Tobacco use: Secondary | ICD-10-CM | POA: Diagnosis not present

## 2021-10-17 DIAGNOSIS — R7303 Prediabetes: Secondary | ICD-10-CM | POA: Diagnosis not present

## 2021-10-17 DIAGNOSIS — R5383 Other fatigue: Secondary | ICD-10-CM | POA: Diagnosis not present

## 2021-10-17 DIAGNOSIS — Z7689 Persons encountering health services in other specified circumstances: Secondary | ICD-10-CM | POA: Diagnosis not present

## 2021-10-17 LAB — POCT URINALYSIS DIPSTICK
Bilirubin, UA: NEGATIVE
Blood, UA: NEGATIVE
Glucose, UA: NEGATIVE
Ketones, UA: NEGATIVE
Leukocytes, UA: NEGATIVE
Nitrite, UA: NEGATIVE
Protein, UA: NEGATIVE
Spec Grav, UA: <=1.005 — AB (ref 1.010–1.025)
Urobilinogen, UA: 0.2 E.U./dL
pH, UA: 6 (ref 5.0–8.0)

## 2021-10-17 NOTE — Assessment & Plan Note (Addendum)
Encouraged patient to stop smoking and discussed the options to quit smoking.  ?

## 2021-10-17 NOTE — Assessment & Plan Note (Signed)
Stable at present. ?On albuterol inhaler as needed. ?Referral send to pulmonology.  ?

## 2021-10-17 NOTE — Progress Notes (Signed)
? ?New Patient Office Visit ? ?Subjective:  ?Patient ID: Sarah Weber, female    DOB: 1956/06/29  Age: 66 y.o. MRN: [id number] ? ?CC:  ?Chief Complaint  ?Patient presents with  ? New Patient (Initial Visit)  ? ? ?HPI ?Sarah Weber presents for establishing care. Her previous PCP was Dr. Ruthann Cancer at [facility]. Pt said she lost weight unintentionally. She was 181 lb in November and lose 16 lb since then. She weight 165 lb in the office today. Patient reports she does not feel hungry as before and also complaints of being fatigued. Reports no blood in stool or constipation. She has  occasionally shortness of breath. Patient has h/o blood in urine.  ? ?Past Medical History:  ?Diagnosis Date  ? Asthma   ? BCC (basal cell carcinoma)   ? Thyroid disease   ? ? ?Past Surgical History:  ?Procedure Laterality Date  ? CESAREAN SECTION    ? x 2  ? TUBAL LIGATION    ? TUMOR REMOVAL    ? Two benign tumors removed from lower back  ? ? ?Family History  ?Problem Relation Age of Onset  ? Parkinson's disease Mother   ? Kidney disease Mother   ? Hypertension Mother   ? Cancer Father   ?     Pancreatic  ? Diabetes Sister   ? Diabetes Sister   ? Diabetes Maternal Grandmother   ? Diabetes Paternal Grandmother   ? Breast cancer Paternal Grandmother 62  ? ? ?Social History  ? ?Socioeconomic History  ? Marital status: Married  ?  Spouse name: Not on file  ? Number of children: Not on file  ? Years of education: Not on file  ? Highest education level: Not on file  ?Occupational History  ? Not on file  ?Tobacco Use  ? Smoking status: Every Day  ?  Packs/day: 1.00  ?  Years: 40.00  ?  Pack years: 40.00  ?  Types: Cigarettes  ? Smokeless tobacco: Not on file  ?Substance and Sexual Activity  ? Alcohol use: Yes  ?  Alcohol/week: 6.0 standard drinks  ?  Types: 6 Standard drinks or equivalent per week  ? Drug use: No  ? Sexual activity: Not on file  ?Other Topics Concern  ? Not on file  ?Social History Narrative  ? Not on file  ? ?Social  Determinants of Health  ? ?Financial Resource Strain: Not on file  ?Food Insecurity: Not on file  ?Transportation Needs: Not on file  ?Physical Activity: Not on file  ?Stress: Not on file  ?Social Connections: Not on file  ?Intimate Partner Violence: Not on file  ? ? ?ROS ?Review of Systems  ?Constitutional:  Positive for appetite change, fatigue and unexpected weight change.  ?HENT: Negative.    ?Eyes: Negative.   ?Respiratory:  Positive for shortness of breath and wheezing. Negative for chest tightness.   ?Cardiovascular:  Negative for chest pain and palpitations.  ?Gastrointestinal:  Negative for abdominal pain and blood in stool.  ?Genitourinary:  Negative for difficulty urinating and frequency.  ?Musculoskeletal: Negative.   ?Skin:  Negative for color change and pallor.  ?Neurological:  Positive for weakness. Negative for dizziness and light-headedness.  ?Psychiatric/Behavioral:  Negative for agitation, behavioral problems and confusion.   ? ?Objective:  ? ?Today's Vitals: BP (!) 152/87   Pulse 90   Ht 5' 7.5" (1.715 m)   Wt 165 lb (74.8 kg)   BMI 25.46 kg/m?  ? ?Physical Exam ?Constitutional:   ?  Appearance: Normal appearance.  ?HENT:  ?   Head: Normocephalic.  ?   Right Ear: Tympanic membrane normal.  ?   Left Ear: Tympanic membrane normal.  ?   Nose: Nose normal.  ?   Mouth/Throat:  ?   Mouth: Mucous membranes are moist.  ?Eyes:  ?   Pupils: Pupils are equal, round, and reactive to light.  ?Cardiovascular:  ?   Rate and Rhythm: Normal rate and regular rhythm.  ?Pulmonary:  ?   Effort: Pulmonary effort is normal.  ?   Breath sounds: Normal breath sounds.  ?Abdominal:  ?   General: Bowel sounds are normal.  ?   Palpations: Abdomen is soft.  ?Musculoskeletal:     ?   General: No swelling.  ?   Cervical back: Normal range of motion.  ?   Comments: Tremors: upper extremity bilaterally.   ?Skin: ?   General: Skin is warm.  ?   Capillary Refill: Capillary refill takes less than 2 seconds.  ?Neurological:  ?    General: No focal deficit present.  ?   Mental Status: She is alert and oriented to person, place, and time. Mental status is at baseline.  ?Psychiatric:     ?   Mood and Affect: Mood normal.     ?   Behavior: Behavior normal.     ?   Thought Content: Thought content normal.     ?   Judgment: Judgment normal.  ? ? ?Assessment & Plan:  ? ?Problem List Items Addressed This Visit   ? ?  ? Respiratory  ? Asthma  ?  Stable at present. ?On albuterol inhaler as needed. ?Referral send to pulmonology.  ?  ?  ? Relevant Orders  ? Ambulatory referral to Pulmonology  ?  ? Other  ? Tobacco abuse  ?  Encouraged patient to stop smoking and discussed the options to quit smoking.  ?  ?  ? Establishing care with new doctor, encounter for - Primary  ?  Screening labs ordered. ?  ?  ? Relevant Orders  ? COMPLETE METABOLIC PANEL WITH GFR  ? CBC with Differential/Platelet  ? Lipid panel  ? POCT urinalysis dipstick (Completed)  ? Fatigue  ? Relevant Orders  ? TSH  ? Prediabetes  ? Relevant Orders  ? Hemoglobin A1c  ? ? ?Outpatient Encounter Medications as of 10/17/2021  ?Medication Sig  ? Multiple Vitamin (MULTIVITAMIN) capsule Take 1 capsule by mouth daily.  ? albuterol (PROVENTIL HFA;VENTOLIN HFA) 108 (90 Base) MCG/ACT inhaler Inhale 2 puffs into the lungs every 6 (six) hours as needed for wheezing or shortness of breath.  ? [DISCONTINUED] Calcium Carbonate Antacid (ANTACID E-X PO) Take by mouth as needed.  ? ?No facility-administered encounter medications on file as of 10/17/2021.  ? ? ?Follow-up: Return in about 1 week (around 10/24/2021).  ? ?Theresia Lo, NP ? ?

## 2021-10-17 NOTE — Assessment & Plan Note (Signed)
Screening labs ordered

## 2021-10-18 LAB — COMPLETE METABOLIC PANEL WITH GFR
AG Ratio: 1.6 (calc) (ref 1.0–2.5)
ALT: 22 U/L (ref 6–29)
AST: 21 U/L (ref 10–35)
Albumin: 4.7 g/dL (ref 3.6–5.1)
Alkaline phosphatase (APISO): 70 U/L (ref 37–153)
BUN: 9 mg/dL (ref 7–25)
CO2: 22 mmol/L (ref 20–32)
Calcium: 10 mg/dL (ref 8.6–10.4)
Chloride: 95 mmol/L — ABNORMAL LOW (ref 98–110)
Creat: 0.71 mg/dL (ref 0.50–1.05)
Globulin: 3 g/dL (calc) (ref 1.9–3.7)
Glucose, Bld: 115 mg/dL — ABNORMAL HIGH (ref 65–99)
Potassium: 4.6 mmol/L (ref 3.5–5.3)
Sodium: 131 mmol/L — ABNORMAL LOW (ref 135–146)
Total Bilirubin: 0.4 mg/dL (ref 0.2–1.2)
Total Protein: 7.7 g/dL (ref 6.1–8.1)
eGFR: 95 mL/min/{1.73_m2} (ref >=60)

## 2021-10-18 LAB — CBC WITH DIFFERENTIAL/PLATELET
Absolute Monocytes: 863 cells/uL (ref 200–950)
Basophils Absolute: 83 cells/uL (ref 0–200)
Basophils Relative: 0.8 %
Eosinophils Absolute: 73 cells/uL (ref 15–500)
Eosinophils Relative: 0.7 %
HCT: 43.3 % (ref 35.0–45.0)
Hemoglobin: 14.8 g/dL (ref 11.7–15.5)
Lymphs Abs: 2558 cells/uL (ref 850–3900)
MCH: 32 pg (ref 27.0–33.0)
MCHC: 34.2 g/dL (ref 32.0–36.0)
MCV: 93.7 fL (ref 80.0–100.0)
MPV: 10 fL (ref 7.5–12.5)
Monocytes Relative: 8.3 %
Neutro Abs: 6822 cells/uL (ref 1500–7800)
Neutrophils Relative %: 65.6 %
Platelets: 425 10*3/uL — ABNORMAL HIGH (ref 140–400)
RBC: 4.62 10*6/uL (ref 3.80–5.10)
RDW: 12.1 % (ref 11.0–15.0)
Total Lymphocyte: 24.6 %
WBC: 10.4 10*3/uL (ref 3.8–10.8)

## 2021-10-18 LAB — HEMOGLOBIN A1C
Hgb A1c MFr Bld: 5.3 % of total Hgb (ref <5.7)
Mean Plasma Glucose: 105 mg/dL
eAG (mmol/L): 5.8 mmol/L

## 2021-10-18 LAB — LIPID PANEL
Cholesterol: 192 mg/dL (ref <200)
HDL: 76 mg/dL (ref >=50)
LDL Cholesterol (Calc): 98 mg/dL (calc)
Non-HDL Cholesterol (Calc): 116 mg/dL (calc) (ref <130)
Total CHOL/HDL Ratio: 2.5 (calc) (ref <5.0)
Triglycerides: 88 mg/dL (ref <150)

## 2021-10-18 LAB — TSH: TSH: 2.66 mIU/L (ref 0.40–4.50)

## 2021-10-23 ENCOUNTER — Other Ambulatory Visit: Payer: Self-pay

## 2021-10-23 ENCOUNTER — Ambulatory Visit (INDEPENDENT_AMBULATORY_CARE_PROVIDER_SITE_OTHER): Payer: Managed Care, Other (non HMO) | Admitting: Nurse Practitioner

## 2021-10-23 ENCOUNTER — Encounter: Payer: Self-pay | Admitting: Nurse Practitioner

## 2021-10-23 VITALS — BP 140/75 | HR 92 | Ht 67.5 in | Wt 166.2 lb

## 2021-10-23 DIAGNOSIS — Z720 Tobacco use: Secondary | ICD-10-CM

## 2021-10-23 DIAGNOSIS — E663 Overweight: Secondary | ICD-10-CM | POA: Diagnosis not present

## 2021-10-23 NOTE — Progress Notes (Signed)
? ?Established Patient Office Visit ? ?Subjective:  ?Patient ID: Sarah Weber, female    DOB: [birth year]  Age: 66 y.o. MRN: [id number] ? ?CC:  ?Chief Complaint  ?Patient presents with  ? Lab Results  ? ? ? ?HPI ? ?Sarah Weber presents for lab result review. She is over all doing well. No new complaints at present.  ? ?HPI  ? ?Past Medical History:  ?Diagnosis Date  ? Asthma   ? BCC (basal cell carcinoma)   ? Thyroid disease   ? ? ?Past Surgical History:  ?Procedure Laterality Date  ? CESAREAN SECTION    ? x 2  ? TUBAL LIGATION    ? TUMOR REMOVAL    ? Two benign tumors removed from lower back  ? ? ?Family History  ?Problem Relation Age of Onset  ? Parkinson's disease Mother   ? Kidney disease Mother   ? Hypertension Mother   ? Cancer Father   ?     Pancreatic  ? Diabetes Sister   ? Diabetes Sister   ? Diabetes Maternal Grandmother   ? Diabetes Paternal Grandmother   ? Breast cancer Paternal Grandmother 106  ? ? ?Social History  ? ?Socioeconomic History  ? Marital status: Married  ?  Spouse name: Not on file  ? Number of children: Not on file  ? Years of education: Not on file  ? Highest education level: Not on file  ?Occupational History  ? Not on file  ?Tobacco Use  ? Smoking status: Every Day  ?  Packs/day: 1.00  ?  Years: 40.00  ?  Pack years: 40.00  ?  Types: Cigarettes  ? Smokeless tobacco: Not on file  ?Substance and Sexual Activity  ? Alcohol use: Yes  ?  Alcohol/week: 6.0 standard drinks  ?  Types: 6 Standard drinks or equivalent per week  ? Drug use: No  ? Sexual activity: Not on file  ?Other Topics Concern  ? Not on file  ?Social History Narrative  ? Not on file  ? ?Social Determinants of Health  ? ?Financial Resource Strain: Not on file  ?Food Insecurity: Not on file  ?Transportation Needs: Not on file  ?Physical Activity: Not on file  ?Stress: Not on file  ?Social Connections: Not on file  ?Intimate Partner Violence: Not on file  ? ? ? ?Outpatient Medications Prior to Visit  ?Medication Sig Dispense  Refill  ? albuterol (PROVENTIL HFA;VENTOLIN HFA) 108 (90 Base) MCG/ACT inhaler Inhale 2 puffs into the lungs every 6 (six) hours as needed for wheezing or shortness of breath. 1 Inhaler 0  ? Multiple Vitamin (MULTIVITAMIN) capsule Take 1 capsule by mouth daily.    ? ?No facility-administered medications prior to visit.  ? ? ?Allergies  ?Allergen Reactions  ? Amoxil [Amoxicillin] Itching  ?  Mouth itching  ? Erythromycin   ?  Upset Stomach  ? ? ?ROS ?Review of Systems  ?Constitutional:  Negative for activity change and appetite change.  ?HENT:  Negative for congestion and postnasal drip.   ?Eyes: Negative.   ?Respiratory:  Negative for chest tightness and shortness of breath.   ?Cardiovascular:  Negative for chest pain and leg swelling.  ?Gastrointestinal: Negative.   ?Endocrine: Negative.   ?Genitourinary: Negative.   ?Musculoskeletal:  Negative for back pain and joint swelling.  ?Skin:  Negative for color change and pallor.  ?Neurological:  Negative for dizziness and headaches.  ?Psychiatric/Behavioral:  Negative for agitation, behavioral problems and confusion.   ? ?  ?  Objective:  ?  ?Physical Exam ?Constitutional:   ?   Appearance: Normal appearance. She is obese.  ?HENT:  ?   Head: Normocephalic.  ?   Right Ear: Tympanic membrane normal.  ?   Left Ear: Tympanic membrane normal.  ?   Nose: Nose normal.  ?   Mouth/Throat:  ?   Mouth: Mucous membranes are moist.  ?Eyes:  ?   Pupils: Pupils are equal, round, and reactive to light.  ?Cardiovascular:  ?   Rate and Rhythm: Normal rate and regular rhythm.  ?   Pulses: Normal pulses.  ?   Heart sounds: Normal heart sounds.  ?Pulmonary:  ?   Effort: Pulmonary effort is normal.  ?   Breath sounds: Normal breath sounds.  ?Abdominal:  ?   General: Bowel sounds are normal.  ?   Palpations: Abdomen is soft.  ?Musculoskeletal:  ?   Cervical back: Normal range of motion.  ?Skin: ?   General: Skin is warm.  ?   Capillary Refill: Capillary refill takes less than 2 seconds.   ?Neurological:  ?   General: No focal deficit present.  ?   Mental Status: She is alert and oriented to person, place, and time. Mental status is at baseline.  ?Psychiatric:     ?   Mood and Affect: Mood normal.     ?   Behavior: Behavior normal.     ?   Thought Content: Thought content normal.     ?   Judgment: Judgment normal.  ? ? ?BP 140/75   Pulse 92   Ht 5' 7.5" (1.715 m)   Wt 166 lb 3.2 oz (75.4 kg)   BMI 25.65 kg/m?  ?Wt Readings from Last 3 Encounters:  ?10/23/21 166 lb 3.2 oz (75.4 kg)  ?10/17/21 165 lb (74.8 kg)  ?01/10/18 192 lb (87.1 kg)  ? ? ? ?Health Maintenance Due  ?Topic Date Due  ? COVID-19 Vaccine (1) Never done  ? HIV Screening  Never done  ? Hepatitis C Screening  Never done  ? TETANUS/TDAP  Never done  ? PAP SMEAR-Modifier  Never done  ? COLONOSCOPY (Pts 45-79yr Insurance coverage will need to be confirmed)  Never done  ? Zoster Vaccines- Shingrix (1 of 2) Never done  ? MAMMOGRAM  01/04/2020  ? INFLUENZA VACCINE  Never done  ? ? ?There are no preventive care reminders to display for this patient. ? ?Lab Results  ?Component Value Date  ? TSH 2.66 10/17/2021  ? ?Lab Results  ?Component Value Date  ? WBC 10.4 10/17/2021  ? HGB 14.8 10/17/2021  ? HCT 43.3 10/17/2021  ? MCV 93.7 10/17/2021  ? PLT 425 (H) 10/17/2021  ? ?Lab Results  ?Component Value Date  ? NA 131 (L) 10/17/2021  ? K 4.6 10/17/2021  ? CO2 22 10/17/2021  ? GLUCOSE 115 (H) 10/17/2021  ? BUN 9 10/17/2021  ? CREATININE 0.71 10/17/2021  ? BILITOT 0.4 10/17/2021  ? ALKPHOS 58 01/10/2018  ? AST 21 10/17/2021  ? ALT 22 10/17/2021  ? PROT 7.7 10/17/2021  ? ALBUMIN 4.2 01/10/2018  ? CALCIUM 10.0 10/17/2021  ? ANIONGAP 7 01/10/2018  ? EGFR 95 10/17/2021  ? ?Lab Results  ?Component Value Date  ? CHOL 192 10/17/2021  ? ?Lab Results  ?Component Value Date  ? HDL 76 10/17/2021  ? ?Lab Results  ?Component Value Date  ? [place]98 10/17/2021  ? ?Lab Results  ?Component Value Date  ? TRIG 88 10/17/2021  ? ?  Lab Results  ?Component Value Date  ?  CHOLHDL 2.5 10/17/2021  ? ?Lab Results  ?Component Value Date  ? HGBA1C 5.3 10/17/2021  ? ? ?  ?Assessment & Plan:  ? ?Problem List Items Addressed This Visit   ? ?  ? Other  ? Tobacco abuse - Primary  ?  Advise pt to quit smoking. ?Educated about the risk factor associated  with smoking  ?  ?  ? Overweight (BMI 25.0-29.9)  ?  BMI 25.65 ?Advised pt to watch diet and follow regular physical activity schedule.  ?  ?  ? ?Patient has elevated platelets count  will continue to monitor. ?Will repeat the platelets count in 4 weeks. ? ? ?No orders of the defined types were placed in this encounter. ? ? ? ?Follow-up: No follow-ups on file.  ? ? ?Theresia Lo, NP ?

## 2021-10-27 DIAGNOSIS — E663 Overweight: Secondary | ICD-10-CM | POA: Insufficient documentation

## 2021-10-27 NOTE — Assessment & Plan Note (Signed)
BMI 25.65 ?Advised pt to watch diet and follow regular physical activity schedule.  ?

## 2021-10-27 NOTE — Assessment & Plan Note (Signed)
Advise pt to quit smoking. ?Educated about the risk factor associated  with smoking  ?

## 2021-12-04 ENCOUNTER — Ambulatory Visit (INDEPENDENT_AMBULATORY_CARE_PROVIDER_SITE_OTHER): Payer: Managed Care, Other (non HMO) | Admitting: Nurse Practitioner

## 2021-12-04 ENCOUNTER — Encounter: Payer: Self-pay | Admitting: Nurse Practitioner

## 2021-12-04 VITALS — BP 139/89 | HR 74 | Ht 67.5 in | Wt 163.2 lb

## 2021-12-04 DIAGNOSIS — J4520 Mild intermittent asthma, uncomplicated: Secondary | ICD-10-CM

## 2021-12-04 DIAGNOSIS — Z720 Tobacco use: Secondary | ICD-10-CM

## 2021-12-04 DIAGNOSIS — R7989 Other specified abnormal findings of blood chemistry: Secondary | ICD-10-CM | POA: Insufficient documentation

## 2021-12-04 LAB — CBC WITH DIFFERENTIAL/PLATELET
Absolute Monocytes: 818 cells/uL (ref 200–950)
Basophils Absolute: 93 cells/uL (ref 0–200)
Basophils Relative: 1 %
Eosinophils Absolute: 93 cells/uL (ref 15–500)
Eosinophils Relative: 1 %
HCT: 44.3 % (ref 35.0–45.0)
Hemoglobin: 15.1 g/dL (ref 11.7–15.5)
Lymphs Abs: 2678 cells/uL (ref 850–3900)
MCH: 32.1 pg (ref 27.0–33.0)
MCHC: 34.1 g/dL (ref 32.0–36.0)
MCV: 94.3 fL (ref 80.0–100.0)
MPV: 10.4 fL (ref 7.5–12.5)
Monocytes Relative: 8.8 %
Neutro Abs: 5617 cells/uL (ref 1500–7800)
Neutrophils Relative %: 60.4 %
Platelets: 393 10*3/uL (ref 140–400)
RBC: 4.7 10*6/uL (ref 3.80–5.10)
RDW: 11.8 % (ref 11.0–15.0)
Total Lymphocyte: 28.8 %
WBC: 9.3 10*3/uL (ref 3.8–10.8)

## 2021-12-04 NOTE — Progress Notes (Signed)
? ?Established Patient Office Visit ? ?Subjective:  ?Patient ID: Sarah Weber, female    DOB: Mar 02, 1956  Age: 66 y.o. MRN: [id number] ? ?CC:  ?Chief Complaint  ?Patient presents with  ? Follow-up  ? ? ? ?HPI ? ?Sarah Weber presents for follow up on elevated platelets. Patient is doing fine. She has received a jury duty letter and is seeking exemption due to her health condition. Patient complaints of off and on leg pain. ? ?HPI  ? ?Past Medical History:  ?Diagnosis Date  ? Asthma   ? BCC (basal cell carcinoma)   ? Thyroid disease   ? ? ?Past Surgical History:  ?Procedure Laterality Date  ? CESAREAN SECTION    ? x 2  ? TUBAL LIGATION    ? TUMOR REMOVAL    ? Two benign tumors removed from lower back  ? ? ?Family History  ?Problem Relation Age of Onset  ? Parkinson's disease Mother   ? Kidney disease Mother   ? Hypertension Mother   ? Cancer Father   ?     Pancreatic  ? Diabetes Sister   ? Diabetes Sister   ? Diabetes Maternal Grandmother   ? Diabetes Paternal Grandmother   ? Breast cancer Paternal Grandmother 64  ? ? ?Social History  ? ?Socioeconomic History  ? Marital status: Married  ?  Spouse name: Not on file  ? Number of children: Not on file  ? Years of education: Not on file  ? Highest education level: Not on file  ?Occupational History  ? Not on file  ?Tobacco Use  ? Smoking status: Every Day  ?  Packs/day: 1.00  ?  Years: 40.00  ?  Pack years: 40.00  ?  Types: Cigarettes  ? Smokeless tobacco: Not on file  ?Substance and Sexual Activity  ? Alcohol use: Yes  ?  Alcohol/week: 6.0 standard drinks  ?  Types: 6 Standard drinks or equivalent per week  ? Drug use: No  ? Sexual activity: Not on file  ?Other Topics Concern  ? Not on file  ?Social History Narrative  ? Not on file  ? ?Social Determinants of Health  ? ?Financial Resource Strain: Not on file  ?Food Insecurity: Not on file  ?Transportation Needs: Not on file  ?Physical Activity: Not on file  ?Stress: Not on file  ?Social Connections: Not on file   ?Intimate Partner Violence: Not on file  ? ? ? ?Outpatient Medications Prior to Visit  ?Medication Sig Dispense Refill  ? albuterol (PROVENTIL HFA;VENTOLIN HFA) 108 (90 Base) MCG/ACT inhaler Inhale 2 puffs into the lungs every 6 (six) hours as needed for wheezing or shortness of breath. 1 Inhaler 0  ? Multiple Vitamin (MULTIVITAMIN) capsule Take 1 capsule by mouth daily.    ? ?No facility-administered medications prior to visit.  ? ? ?Allergies  ?Allergen Reactions  ? Amoxil [Amoxicillin] Itching  ?  Mouth itching  ? Erythromycin   ?  Upset Stomach  ? ? ?ROS ?Review of Systems  ?Constitutional:  Positive for fatigue. Negative for activity change and appetite change.  ?HENT:  Negative for congestion and ear pain.   ?Eyes: Negative.   ?Respiratory:  Negative for chest tightness and shortness of breath.   ?Cardiovascular:  Negative for chest pain and palpitations.  ?Gastrointestinal:  Negative for abdominal distention and constipation.  ?Genitourinary:  Negative for difficulty urinating and pelvic pain.  ?Musculoskeletal:  Positive for arthralgias. Negative for joint swelling.  ?Neurological:  Negative for  dizziness, facial asymmetry and headaches.  ? ?  ?Objective:  ?  ?Physical Exam ?Constitutional:   ?   Appearance: Normal appearance. She is obese.  ?HENT:  ?   Head: Normocephalic and atraumatic.  ?   Right Ear: Tympanic membrane normal.  ?   Left Ear: Tympanic membrane normal.  ?   Nose: Nose normal.  ?   Mouth/Throat:  ?   Mouth: Mucous membranes are moist.  ?   Pharynx: Oropharynx is clear.  ?Eyes:  ?   Extraocular Movements: Extraocular movements intact.  ?   Conjunctiva/sclera: Conjunctivae normal.  ?   Pupils: Pupils are equal, round, and reactive to light.  ?Cardiovascular:  ?   Rate and Rhythm: Normal rate and regular rhythm.  ?   Pulses: Normal pulses.  ?   Heart sounds: Normal heart sounds.  ?Pulmonary:  ?   Effort: Pulmonary effort is normal.  ?   Breath sounds: Normal breath sounds.  ?Abdominal:  ?    General: Bowel sounds are normal.  ?   Palpations: Abdomen is soft.  ?Musculoskeletal:     ?   General: Normal range of motion.  ?   Cervical back: Normal range of motion.  ?Skin: ?   General: Skin is warm.  ?   Capillary Refill: Capillary refill takes less than 2 seconds.  ?Neurological:  ?   General: No focal deficit present.  ?   Mental Status: She is alert and oriented to person, place, and time. Mental status is at baseline.  ?Psychiatric:     ?   Mood and Affect: Mood normal.     ?   Behavior: Behavior normal.     ?   Thought Content: Thought content normal.     ?   Judgment: Judgment normal.  ? ? ?BP 139/89   Pulse 74   Ht 5' 7.5" (1.715 m)   Wt 163 lb 3.2 oz (74 kg)   BMI 25.18 kg/m?  ?Wt Readings from Last 3 Encounters:  ?12/04/21 163 lb 3.2 oz (74 kg)  ?10/23/21 166 lb 3.2 oz (75.4 kg)  ?10/17/21 165 lb (74.8 kg)  ? ? ? ?Health Maintenance Due  ?Topic Date Due  ? COVID-19 Vaccine (1) Never done  ? HIV Screening  Never done  ? Hepatitis C Screening  Never done  ? TETANUS/TDAP  Never done  ? PAP SMEAR-Modifier  Never done  ? Zoster Vaccines- Shingrix (1 of 2) Never done  ? ? ?There are no preventive care reminders to display for this patient. ? ?Lab Results  ?Component Value Date  ? TSH 2.66 10/17/2021  ? ?Lab Results  ?Component Value Date  ? WBC 9.3 12/04/2021  ? HGB 15.1 12/04/2021  ? HCT 44.3 12/04/2021  ? MCV 94.3 12/04/2021  ? PLT 393 12/04/2021  ? ?Lab Results  ?Component Value Date  ? NA 131 (L) 10/17/2021  ? K 4.6 10/17/2021  ? CO2 22 10/17/2021  ? GLUCOSE 115 (H) 10/17/2021  ? BUN 9 10/17/2021  ? CREATININE 0.71 10/17/2021  ? BILITOT 0.4 10/17/2021  ? ALKPHOS 58 01/10/2018  ? AST 21 10/17/2021  ? ALT 22 10/17/2021  ? PROT 7.7 10/17/2021  ? ALBUMIN 4.2 01/10/2018  ? CALCIUM 10.0 10/17/2021  ? ANIONGAP 7 01/10/2018  ? EGFR 95 10/17/2021  ? ?Lab Results  ?Component Value Date  ? CHOL 192 10/17/2021  ? ?Lab Results  ?Component Value Date  ? HDL 76 10/17/2021  ? ?Lab Results  ?Component  Value Date   ? [place] 98 10/17/2021  ? ?Lab Results  ?Component Value Date  ? TRIG 88 10/17/2021  ? ?Lab Results  ?Component Value Date  ? CHOLHDL 2.5 10/17/2021  ? ?Lab Results  ?Component Value Date  ? HGBA1C 5.3 10/17/2021  ? ? ?  ?Assessment & Plan:  ? ?Problem List Items Addressed This Visit   ? ?  ? Respiratory  ? Asthma  ?  Stable with inhaler.  ? ?  ?  ?  ? Other  ? Tobacco abuse  ?  Encouraged patient to quit smoking. ?Patient not willing to quit at present.   ? ?  ?  ? Elevated platelet count - Primary  ?  Labs ordered. ? ?  ?  ? Relevant Orders  ? CBC with Differential/Platelet (Completed)  ? ? ? ?No orders of the defined types were placed in this encounter. ? ? ? ?Follow-up: No follow-ups on file.  ? ? ?Theresia Lo, NP ?

## 2021-12-11 NOTE — Assessment & Plan Note (Signed)
Stable with inhaler.  ?

## 2021-12-11 NOTE — Progress Notes (Signed)
Results are normal.  ?Call the pt.

## 2021-12-11 NOTE — Assessment & Plan Note (Signed)
Labs ordered.

## 2021-12-11 NOTE — Assessment & Plan Note (Signed)
Encouraged patient to quit smoking. ?Patient not willing to quit at present.   ?

## 2022-01-22 ENCOUNTER — Ambulatory Visit: Payer: Managed Care, Other (non HMO) | Admitting: Nurse Practitioner

## 2022-01-29 ENCOUNTER — Ambulatory Visit (INDEPENDENT_AMBULATORY_CARE_PROVIDER_SITE_OTHER): Payer: Commercial Managed Care - HMO | Admitting: Nurse Practitioner

## 2022-01-29 ENCOUNTER — Encounter: Payer: Self-pay | Admitting: Nurse Practitioner

## 2022-01-29 VITALS — BP 137/80 | HR 88 | Ht 67.5 in | Wt 165.1 lb

## 2022-01-29 DIAGNOSIS — H9202 Otalgia, left ear: Secondary | ICD-10-CM | POA: Insufficient documentation

## 2022-01-29 MED ORDER — CIPROFLOXACIN-DEXAMETHASONE 0.3-0.1 % OT SUSP: 4.0000 [drp] | Freq: Two times a day (BID) | OTIC | 0 refills | Status: DC

## 2022-01-29 NOTE — Progress Notes (Signed)
Established Patient Office Visit  Subjective:  Patient ID: Sarah Weber, female    DOB: [birth year]  Age: 66 y.o. MRN: [id number]  CC:  Chief Complaint  Patient presents with   Ear Pain    Patient reports left ear pain for 3 weeks, pain comes and goes. No drainage.     HPI  Sarah Weber presents for left ear pain.  Otalgia  There is pain in the left ear. This is a new problem. The problem has been waxing and waning. There has been no fever. Pertinent negatives include no headaches. She has tried nothing for the symptoms.    No other complaints at present.  Past Medical History:  Diagnosis Date   Asthma    BCC (basal cell carcinoma)    Thyroid disease     Past Surgical History:  Procedure Laterality Date   CESAREAN SECTION     x 2   TUBAL LIGATION     TUMOR REMOVAL     Two benign tumors removed from lower back    Family History  Problem Relation Age of Onset   Parkinson's disease Mother    Kidney disease Mother    Hypertension Mother    Cancer Father        Pancreatic   Diabetes Sister    Diabetes Sister    Diabetes Maternal Grandmother    Diabetes Paternal Grandmother    Breast cancer Paternal Grandmother 25    Social History   Socioeconomic History   Marital status: Married    Spouse name: Not on file   Number of children: Not on file   Years of education: Not on file   Highest education level: Not on file  Occupational History   Not on file  Tobacco Use   Smoking status: Every Day    Packs/day: 1.00    Years: 40.00    Total pack years: 40.00    Types: Cigarettes   Smokeless tobacco: Not on file  Substance and Sexual Activity   Alcohol use: Yes    Alcohol/week: 6.0 standard drinks of alcohol    Types: 6 Standard drinks or equivalent per week   Drug use: No   Sexual activity: Not on file  Other Topics Concern   Not on file  Social History Narrative   Not on file   Social Determinants of Health   Financial Resource Strain: Not on  file  Food Insecurity: Not on file  Transportation Needs: Not on file  Physical Activity: Not on file  Stress: Not on file  Social Connections: Not on file  Intimate Partner Violence: Not on file     Outpatient Medications Prior to Visit  Medication Sig Dispense Refill   albuterol (PROVENTIL HFA;VENTOLIN HFA) 108 (90 Base) MCG/ACT inhaler Inhale 2 puffs into the lungs every 6 (six) hours as needed for wheezing or shortness of breath. 1 Inhaler 0   Multiple Vitamin (MULTIVITAMIN) capsule Take 1 capsule by mouth daily.     No facility-administered medications prior to visit.    Allergies  Allergen Reactions   Amoxil [Amoxicillin] Itching    Mouth itching   Erythromycin     Upset Stomach    ROS Review of Systems  Constitutional:  Negative for activity change and appetite change.  HENT:  Positive for ear pain. Negative for congestion and postnasal drip.   Eyes: Negative.   Respiratory:  Negative for chest tightness and shortness of breath.   Cardiovascular:  Negative for chest pain  and leg swelling.  Gastrointestinal: Negative.   Endocrine: Negative.   Genitourinary: Negative.   Musculoskeletal:  Negative for back pain and joint swelling.  Skin:  Negative for color change and pallor.  Neurological:  Negative for dizziness and headaches.  Psychiatric/Behavioral:  Negative for agitation, behavioral problems and confusion.       Objective:    Physical Exam Constitutional:      Appearance: Normal appearance. She is obese.  HENT:     Head: Normocephalic.     Right Ear: Tympanic membrane normal.     Ears:     Comments: External canal erythematous    Nose: Nose normal.     Mouth/Throat:     Mouth: Mucous membranes are moist.  Eyes:     Pupils: Pupils are equal, round, and reactive to light.  Cardiovascular:     Rate and Rhythm: Normal rate and regular rhythm.     Pulses: Normal pulses.     Heart sounds: Normal heart sounds.  Pulmonary:     Effort: Pulmonary effort  is normal.     Breath sounds: Normal breath sounds.  Abdominal:     General: Bowel sounds are normal.     Palpations: Abdomen is soft.  Musculoskeletal:     Cervical back: Normal range of motion.  Skin:    General: Skin is warm.     Capillary Refill: Capillary refill takes less than 2 seconds.  Neurological:     General: No focal deficit present.     Mental Status: She is alert and oriented to person, place, and time. Mental status is at baseline.  Psychiatric:        Mood and Affect: Mood normal.        Behavior: Behavior normal.        Thought Content: Thought content normal.        Judgment: Judgment normal.     BP 137/80   Pulse 88   Ht 5' 7.5" (1.715 m)   Wt 165 lb 1.6 oz (74.9 kg)   BMI 25.48 kg/m  Wt Readings from Last 3 Encounters:  01/29/22 165 lb 1.6 oz (74.9 kg)  12/04/21 163 lb 3.2 oz (74 kg)  10/23/21 166 lb 3.2 oz (75.4 kg)     Health Maintenance Due  Topic Date Due   COVID-19 Vaccine (1) Never done   HIV Screening  Never done   Hepatitis C Screening  Never done   TETANUS/TDAP  Never done   PAP SMEAR-Modifier  Never done   Zoster Vaccines- Shingrix (1 of 2) Never done    There are no preventive care reminders to display for this patient.  Lab Results  Component Value Date   TSH 2.66 10/17/2021   Lab Results  Component Value Date   WBC 9.3 12/04/2021   HGB 15.1 12/04/2021   HCT 44.3 12/04/2021   MCV 94.3 12/04/2021   PLT 393 12/04/2021   Lab Results  Component Value Date   NA 131 (L) 10/17/2021   K 4.6 10/17/2021   CO2 22 10/17/2021   GLUCOSE 115 (H) 10/17/2021   BUN 9 10/17/2021   CREATININE 0.71 10/17/2021   BILITOT 0.4 10/17/2021   ALKPHOS 58 01/10/2018   AST 21 10/17/2021   ALT 22 10/17/2021   PROT 7.7 10/17/2021   ALBUMIN 4.2 01/10/2018   CALCIUM 10.0 10/17/2021   ANIONGAP 7 01/10/2018   EGFR 95 10/17/2021   Lab Results  Component Value Date   CHOL 192 10/17/2021     Lab Results  Component Value Date   HDL 76 10/17/2021    Lab Results  Component Value Date   LDLCALC 98 10/17/2021   Lab Results  Component Value Date   TRIG 88 10/17/2021   Lab Results  Component Value Date   CHOLHDL 2.5 10/17/2021   Lab Results  Component Value Date   HGBA1C 5.3 10/17/2021      Assessment & Plan:   Problem List Items Addressed This Visit       Other   Otalgia of left ear - Primary    Started her on Ciprodex eardrops.          Meds ordered this encounter  Medications   ciprofloxacin-dexamethasone (CIPRODEX) OTIC suspension    Sig: Place 4 drops into the left ear 2 (two) times daily.    Dispense:  7.5 mL    Refill:  0     Follow-up: No follow-ups on file.    Theresia Lo, NP

## 2022-01-29 NOTE — Assessment & Plan Note (Signed)
Started her on Ciprodex eardrops.

## 2022-02-03 ENCOUNTER — Institutional Professional Consult (permissible substitution): Payer: Commercial Managed Care - HMO | Admitting: Pulmonary Disease

## 2022-04-25 ENCOUNTER — Other Ambulatory Visit: Payer: Self-pay | Admitting: *Deleted

## 2022-04-25 DIAGNOSIS — Z1231 Encounter for screening mammogram for malignant neoplasm of breast: Secondary | ICD-10-CM

## 2022-05-22 ENCOUNTER — Encounter: Payer: Self-pay | Admitting: *Deleted

## 2022-05-22 ENCOUNTER — Encounter: Payer: Self-pay | Admitting: Nurse Practitioner

## 2022-05-22 ENCOUNTER — Ambulatory Visit (INDEPENDENT_AMBULATORY_CARE_PROVIDER_SITE_OTHER): Payer: Medicare (Managed Care) | Admitting: Nurse Practitioner

## 2022-05-22 VITALS — BP 137/82 | HR 69 | Ht 67.5 in | Wt 164.4 lb

## 2022-05-22 DIAGNOSIS — J4520 Mild intermittent asthma, uncomplicated: Secondary | ICD-10-CM | POA: Diagnosis not present

## 2022-05-22 DIAGNOSIS — Z720 Tobacco use: Secondary | ICD-10-CM

## 2022-05-22 DIAGNOSIS — R7989 Other specified abnormal findings of blood chemistry: Secondary | ICD-10-CM

## 2022-05-22 MED ORDER — ALBUTEROL SULFATE HFA 108 (90 BASE) MCG/ACT IN AERS: 2.0000 | INHALATION_SPRAY | Freq: Four times a day (QID) | RESPIRATORY_TRACT | 11 refills | Status: AC | PRN

## 2022-05-22 NOTE — Assessment & Plan Note (Signed)
She had elevated platelets, and low sodium level  in the past we will repeat the lab

## 2022-05-22 NOTE — Progress Notes (Signed)
Established Patient Office Visit  Subjective:  Patient ID: Sarah Weber, female    DOB: [birth year]  Age: 66 y.o. MRN: [id number]  CC:  Chief Complaint  Patient presents with   Follow-up     HPI  Sarah Weber presents for routine follow up. She is overall doing fine. No concerns at present.  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   HPI   Past Medical History:  Diagnosis Date   Asthma    BCC (basal cell carcinoma)    Thyroid disease     Past Surgical History:  Procedure Laterality Date   CESAREAN SECTION     x 2   TUBAL LIGATION     TUMOR REMOVAL     Two benign tumors removed from lower back    Family History  Problem Relation Age of Onset   Parkinson's disease Mother    Kidney disease Mother    Hypertension Mother    Cancer Father        Pancreatic   Diabetes Sister    Diabetes Sister    Diabetes Maternal Grandmother    Diabetes Paternal Grandmother    Breast cancer Paternal Grandmother 34    Social History   Socioeconomic History   Marital status: Single    Spouse name: Not on file   Number of children: Not on file   Years of education: Not on file   Highest education level: Not on file  Occupational History   Not on file  Tobacco Use   Smoking status: Every Day    Packs/day: 1.00    Years: 40.00    Total pack years: 40.00    Types: Cigarettes   Smokeless tobacco: Not on file  Substance and Sexual Activity   Alcohol use: Yes    Alcohol/week: 6.0 standard drinks of alcohol    Types: 6 Standard drinks or equivalent per week   Drug use: No   Sexual activity: Not on file  Other Topics Concern   Not on file  Social History Narrative   Not on file   Social Determinants of Health   Financial Resource Strain: Not on file  Food Insecurity: Not on file  Transportation Needs: Not on file  Physical Activity: Not on file  Stress: Not on file  Social Connections: Not on file  Intimate Partner Violence: Not on file     Outpatient  Medications Prior to Visit  Medication Sig Dispense Refill   ciprofloxacin-dexamethasone (CIPRODEX) OTIC suspension Place 4 drops into the left ear 2 (two) times daily. 7.5 mL 0   Multiple Vitamin (MULTIVITAMIN) capsule Take 1 capsule by mouth daily.     albuterol (PROVENTIL HFA;VENTOLIN HFA) 108 (90 Base) MCG/ACT inhaler Inhale 2 puffs into the lungs every 6 (six) hours as needed for wheezing or shortness of breath. 1 Inhaler 0   No facility-administered medications prior to visit.    Allergies  Allergen Reactions   Amoxil [Amoxicillin] Itching    Mouth itching   Erythromycin     Upset Stomach    ROS Review of Systems  Constitutional: Negative.   HENT: Negative.    Eyes: Negative.   Respiratory:  Negative for chest tightness and shortness of breath.   Cardiovascular:  Negative for chest pain and palpitations.  Gastrointestinal: Negative.   Genitourinary: Negative.   Musculoskeletal: Negative.   Psychiatric/Behavioral:  Negative for agitation, behavioral problems and confusion.       Objective:    Physical Exam Constitutional:  Appearance: Normal appearance. She is overweight.  HENT:     Head: Normocephalic.     Right Ear: Tympanic membrane normal.     Left Ear: Tympanic membrane normal.     Nose: Nose normal.     Mouth/Throat:     Mouth: Mucous membranes are moist.     Pharynx: Oropharynx is clear.  Eyes:     Extraocular Movements: Extraocular movements intact.     Conjunctiva/sclera: Conjunctivae normal.     Pupils: Pupils are equal, round, and reactive to light.  Cardiovascular:     Rate and Rhythm: Normal rate and regular rhythm.     Pulses: Normal pulses.     Heart sounds: Normal heart sounds.  Pulmonary:     Effort: Pulmonary effort is normal. No respiratory distress.     Breath sounds: Normal breath sounds. No rhonchi.  Abdominal:     General: Bowel sounds are normal.     Palpations: Abdomen is soft. There is no mass.     Tenderness: There is no  abdominal tenderness.     Hernia: No hernia is present.  Musculoskeletal:        General: Normal range of motion.     Cervical back: Neck supple. No tenderness.  Skin:    General: Skin is warm.     Capillary Refill: Capillary refill takes less than 2 seconds.  Neurological:     General: No focal deficit present.     Mental Status: She is alert and oriented to person, place, and time. Mental status is at baseline.  Psychiatric:        Mood and Affect: Mood normal.        Behavior: Behavior normal.        Thought Content: Thought content normal.        Judgment: Judgment normal.     BP 137/82   Pulse 69   Ht 5' 7.5" (1.715 m)   Wt 164 lb 6.4 oz (74.6 kg)   BMI 25.37 kg/m  Wt Readings from Last 3 Encounters:  05/22/22 164 lb 6.4 oz (74.6 kg)  05/22/22 164 lb 6.4 oz (74.6 kg)  01/29/22 165 lb 1.6 oz (74.9 kg)     Health Maintenance  Topic Date Due   PAP SMEAR-Modifier  Never done   MAMMOGRAM  01/04/2020   COLONOSCOPY (Pts 45-68yr Insurance coverage will need to be confirmed)  12/05/2022 (Originally 04/18/2002)   Pneumonia Vaccine 65 Years old (1 - PCV) 05/23/2023 (Originally 04/19/1963)   DEXA SCAN  05/23/2023 (Originally 04/18/2022)   TETANUS/TDAP  05/23/2023 (Originally 04/18/1976)   Hepatitis C Screening  05/23/2023 (Originally 04/19/1975)   HIV Screening  05/23/2023 (Originally 04/18/1972)   Zoster Vaccines- Shingrix (1 of 2) 05/23/2023 (Originally 04/19/2007)   INFLUENZA VACCINE  06/07/2023 (Originally 03/17/2022)   COVID-19 Vaccine (1) 06/10/2023 (Originally 10/16/1956)   HPV VACCINES  Aged Out    There are no preventive care reminders to display for this patient.  Lab Results  Component Value Date   TSH 2.66 10/17/2021   Lab Results  Component Value Date   WBC 9.3 12/04/2021   HGB 15.1 12/04/2021   HCT 44.3 12/04/2021   MCV 94.3 12/04/2021   PLT 393 12/04/2021   Lab Results  Component Value Date   NA 131 (L) 10/17/2021   K 4.6 10/17/2021   CO2 22 10/17/2021    GLUCOSE 115 (H) 10/17/2021   BUN 9 10/17/2021   CREATININE 0.71 10/17/2021   BILITOT 0.4 10/17/2021  ALKPHOS 58 01/10/2018   AST 21 10/17/2021   ALT 22 10/17/2021   PROT 7.7 10/17/2021   ALBUMIN 4.2 01/10/2018   CALCIUM 10.0 10/17/2021   ANIONGAP 7 01/10/2018   EGFR 95 10/17/2021   Lab Results  Component Value Date   CHOL 192 10/17/2021   Lab Results  Component Value Date   HDL 76 10/17/2021   Lab Results  Component Value Date   LDLCALC 98 10/17/2021   Lab Results  Component Value Date   TRIG 88 10/17/2021   Lab Results  Component Value Date   CHOLHDL 2.5 10/17/2021   Lab Results  Component Value Date   HGBA1C 5.3 10/17/2021      Assessment & Plan:   Problem List Items Addressed This Visit       Respiratory   Asthma    Stable on medication. Refilled albuterol inhaler.      Relevant Medications   albuterol (VENTOLIN HFA) 108 (90 Base) MCG/ACT inhaler     Other   Tobacco abuse    Smoking cessation was discussed, 5-7 minutes was spent of this topic specifically.        Elevated platelet count - Primary    She had elevated platelets, and low sodium level  in the past we will repeat the lab      Relevant Orders   Basic Metabolic Panel (BMET)   CBC with Differential/Platelet   Letter provided for jury duty.  Meds ordered this encounter  Medications   albuterol (VENTOLIN HFA) 108 (90 Base) MCG/ACT inhaler    Sig: Inhale 2 puffs into the lungs every 6 (six) hours as needed for wheezing or shortness of breath.    Dispense:  1 each    Refill:  11     Follow-up: No follow-ups on file.    Theresia Lo, NP

## 2022-05-22 NOTE — Assessment & Plan Note (Signed)
Smoking cessation was discussed, 5-7 minutes was spent of this topic specifically.   

## 2022-05-22 NOTE — Assessment & Plan Note (Signed)
Stable on medication. Refilled albuterol inhaler.

## 2022-05-23 LAB — CBC WITH DIFFERENTIAL/PLATELET
Absolute Monocytes: 660 cells/uL (ref 200–950)
Basophils Absolute: 107 cells/uL (ref 0–200)
Basophils Relative: 1.1 %
Eosinophils Absolute: 58 cells/uL (ref 15–500)
Eosinophils Relative: 0.6 %
HCT: 43.6 % (ref 35.0–45.0)
Hemoglobin: 15 g/dL (ref 11.7–15.5)
Lymphs Abs: 2842 cells/uL (ref 850–3900)
MCH: 32.5 pg (ref 27.0–33.0)
MCHC: 34.4 g/dL (ref 32.0–36.0)
MCV: 94.4 fL (ref 80.0–100.0)
MPV: 9.8 fL (ref 7.5–12.5)
Monocytes Relative: 6.8 %
Neutro Abs: 6033 cells/uL (ref 1500–7800)
Neutrophils Relative %: 62.2 %
Platelets: 405 10*3/uL — ABNORMAL HIGH (ref 140–400)
RBC: 4.62 10*6/uL (ref 3.80–5.10)
RDW: 11.8 % (ref 11.0–15.0)
Total Lymphocyte: 29.3 %
WBC: 9.7 10*3/uL (ref 3.8–10.8)

## 2022-05-23 LAB — BASIC METABOLIC PANEL
BUN: 9 mg/dL (ref 7–25)
CO2: 20 mmol/L (ref 20–32)
Calcium: 9.9 mg/dL (ref 8.6–10.4)
Chloride: 100 mmol/L (ref 98–110)
Creat: 0.79 mg/dL (ref 0.50–1.05)
Glucose, Bld: 83 mg/dL (ref 65–99)
Potassium: 4.7 mmol/L (ref 3.5–5.3)
Sodium: 134 mmol/L — ABNORMAL LOW (ref 135–146)

## 2022-12-18 DIAGNOSIS — Z720 Tobacco use: Secondary | ICD-10-CM | POA: Diagnosis not present

## 2022-12-18 DIAGNOSIS — Z1389 Encounter for screening for other disorder: Secondary | ICD-10-CM | POA: Diagnosis not present

## 2022-12-18 DIAGNOSIS — J45909 Unspecified asthma, uncomplicated: Secondary | ICD-10-CM | POA: Diagnosis not present

## 2022-12-18 DIAGNOSIS — R5383 Other fatigue: Secondary | ICD-10-CM | POA: Diagnosis not present

## 2022-12-25 DIAGNOSIS — R5383 Other fatigue: Secondary | ICD-10-CM | POA: Diagnosis not present

## 2022-12-25 DIAGNOSIS — Z720 Tobacco use: Secondary | ICD-10-CM | POA: Diagnosis not present

## 2022-12-25 DIAGNOSIS — J45909 Unspecified asthma, uncomplicated: Secondary | ICD-10-CM | POA: Diagnosis not present

## 2022-12-25 DIAGNOSIS — R7989 Other specified abnormal findings of blood chemistry: Secondary | ICD-10-CM | POA: Diagnosis not present

## 2023-02-03 DIAGNOSIS — H40053 Ocular hypertension, bilateral: Secondary | ICD-10-CM | POA: Diagnosis not present

## 2023-02-03 DIAGNOSIS — H2513 Age-related nuclear cataract, bilateral: Secondary | ICD-10-CM | POA: Diagnosis not present

## 2023-05-29 ENCOUNTER — Other Ambulatory Visit: Payer: Self-pay | Admitting: Nurse Practitioner

## 2023-12-14 ENCOUNTER — Encounter: Payer: Self-pay | Admitting: Internal Medicine

## 2023-12-14 ENCOUNTER — Ambulatory Visit (INDEPENDENT_AMBULATORY_CARE_PROVIDER_SITE_OTHER): Payer: Medicare (Managed Care) | Admitting: Internal Medicine

## 2023-12-14 VITALS — BP 138/88 | Ht 67.5 in | Wt 167.6 lb

## 2023-12-14 DIAGNOSIS — J4520 Mild intermittent asthma, uncomplicated: Secondary | ICD-10-CM | POA: Diagnosis not present

## 2023-12-14 DIAGNOSIS — F1090 Alcohol use, unspecified, uncomplicated: Secondary | ICD-10-CM

## 2023-12-14 DIAGNOSIS — F418 Other specified anxiety disorders: Secondary | ICD-10-CM | POA: Diagnosis not present

## 2023-12-14 DIAGNOSIS — Z6825 Body mass index (BMI) 25.0-25.9, adult: Secondary | ICD-10-CM | POA: Diagnosis not present

## 2023-12-14 DIAGNOSIS — Z136 Encounter for screening for cardiovascular disorders: Secondary | ICD-10-CM

## 2023-12-14 DIAGNOSIS — R739 Hyperglycemia, unspecified: Secondary | ICD-10-CM | POA: Diagnosis not present

## 2023-12-14 DIAGNOSIS — R251 Tremor, unspecified: Secondary | ICD-10-CM | POA: Insufficient documentation

## 2023-12-14 DIAGNOSIS — K219 Gastro-esophageal reflux disease without esophagitis: Secondary | ICD-10-CM

## 2023-12-14 DIAGNOSIS — E663 Overweight: Secondary | ICD-10-CM | POA: Diagnosis not present

## 2023-12-14 MED ORDER — DIAZEPAM 5 MG PO TABS: ORAL_TABLET | ORAL | 0 refills | Status: AC

## 2023-12-14 NOTE — Assessment & Plan Note (Signed)
 Discussed likelihood of Parkinson's given her family history however she would not like any further workup at this time as she feels like this is more related to her anxiety

## 2023-12-14 NOTE — Assessment & Plan Note (Signed)
 Encouraged diet and exercise for weight loss ?

## 2023-12-14 NOTE — Assessment & Plan Note (Signed)
 Rx for Valium 5 mg daily as needed, quantity #5 prior to dental appointment

## 2023-12-14 NOTE — Assessment & Plan Note (Addendum)
 Avoid foods that trigger reflux Encourage weight loss as this can help reduce reflux symptoms Continue esomeprazole as needed

## 2023-12-14 NOTE — Patient Instructions (Signed)
 Parkinson's Disease Parkinson's disease causes problems with movement. It makes it harder for you to walk or control your body. Each person with the disease is affected differently. Treatments can help you manage your symptoms. Parkinson's disease can range from mild to very bad, but it gets worse over time. This often happens slowly over many years. What are the causes? Parkinson's disease is caused by a loss of brain cells called neurons. These brain cells make a substance called dopamine, which is needed to control body movement. It's not known what causes the brain cells to die. What increases the risk? Being female. Being 32 years of age or older. Having a family history of Parkinson's disease. Having an injury to the brain in the past. Being around things that are harmful or poisonous, such as pesticides. Having depression. This is when you feel sad or hopeless. What are the signs or symptoms? Symptoms can vary and get worse over time. The main symptoms can be seen in your movement. These include: Shaking or tremors that you can't control. This happens while you're resting. Stiffness in your arms and legs. Losing facial expressions. Walking in a way that isn't normal. You may walk with short, shuffling steps. Loss of balance when standing. You may sway, fall backward, or have trouble making turns. Other symptoms include: Being very sad, worried, or nervous. Having delusions. These are strong beliefs that aren't true. Having hallucinations. This is when you see, hear, taste, smell, or feel things that aren't real. Trouble speaking or swallowing. Trouble pooping (constipation). Needing to pee (urinate) right away, peeing often, or losing control of when you pee or poop. Sleep problems. How is this diagnosed? A diagnosis may be made based on symptoms, your medical history, and a physical exam. You may also have imaging tests that make pictures of your brain. How is this treated? There  is no cure for Parkinson's disease. The goal of treatment is to manage your symptoms. It may include: Medicines. Therapy to help with talking or movement. Surgery to reduce shaking and other movements that you can't control. Follow these instructions at home: Medicines Take your medicines only as told by your health care provider. Avoid taking pain or sleeping medicines. These can affect your thinking. Activity Ask your provider if it's safe for you to drive. Exercise as told by your provider or physical therapist. Lifestyle  Put grab bars and railings in your home, especially near the toilet and in the shower. These help prevent falls. Do not smoke, vape, or use products with nicotine or tobacco in them. If you need help quitting, talk with your provider. Do not drink alcohol. General instructions Talk with your provider to find out what kind of help you need at home. Ask about ways to stay safe. Join a support group for people with Parkinson's disease. Where to find more information [employer] Stroke: [employer] Parkinson's Foundation: parkinson.org Contact a health care provider if: Medicines don't help your symptoms. You have a lot of side effects from your medicines. You keep losing your balance or you fall. You need more help at home. You have trouble swallowing. You have a very hard time pooping. You feel very sad, worried, or confused. You see, hear, taste, smell, or feel things that aren't real. Get help right away if: You were hurt in a fall. You can't swallow without choking. You have chest pain or trouble breathing. You don't feel safe at home. These symptoms may be an emergency.  Call 911 right away. Do not wait to see if the symptoms will go away. Do not drive yourself to the hospital. Also, get help right away if: You feel like you may hurt yourself or others. You have thoughts about taking your own life. Take one of  these steps: Go to your nearest emergency room. Call 911. Call the National Suicide Prevention Lifeline at 919-766-2411 or 988. Text the Crisis Text Line at 4324995006. This information is not intended to replace advice given to you by your health care provider. Make sure you discuss any questions you have with your health care provider. Document Revised: 05/06/2023 Document Reviewed: 10/19/2022 Elsevier Patient Education  2024 [employer].

## 2023-12-14 NOTE — Progress Notes (Signed)
 Subjective:    Patient ID: Sarah Weber, female    DOB: 1956/04/11, 68 y.o.   MRN: [id number]  HPI  Patient presents to the clinic today to establish care and for management of the conditions listed below.  Asthma: She reports chronic cough but denies shortness of breath.  She is using albuterol  only as needed.  There are no PFTs on file. She does smoke.  Thrombocytosis: Her last platelet count was 405, 05/2022.  She does not follow with hematology.  GERD: Triggers by spicy foods and eating late at night. She takes esomeprazole only as needed. There is no upper endoscopy on file.   OA: Mainly in her knees. She takes osteo biflex with good relief of symptoms. She does not follow with orthopedics.  Anxiety: Situational. She is not currently taking any medications for this. She is not currently seeing a therapist. She denies depression, SI/HI.  Review of Systems   Past Medical History:  Diagnosis Date   Asthma    BCC (basal cell carcinoma)    Thyroid  disease     Current Outpatient Medications  Medication Sig Dispense Refill   albuterol  (VENTOLIN  HFA) 108 (90 Base) MCG/ACT inhaler Inhale 2 puffs into the lungs every 6 (six) hours as needed for wheezing or shortness of breath. 1 each 11   Multiple Vitamin (MULTIVITAMIN) capsule Take 1 capsule by mouth daily.     No current facility-administered medications for this visit.    Allergies  Allergen Reactions   Amoxil [Amoxicillin] Itching    Mouth itching   Erythromycin     Upset Stomach    Family History  Problem Relation Age of Onset   Parkinson's disease Mother    Kidney disease Mother    Hypertension Mother    Cancer Father        Pancreatic   Diabetes Sister    Diabetes Sister    Diabetes Maternal Grandmother    Diabetes Paternal Grandmother    Breast cancer Paternal Grandmother 1    Social History   Socioeconomic History   Marital status: Single    Spouse name: Not on file   Number of children: Not on  file   Years of education: Not on file   Highest education level: Some college, no degree  Occupational History   Not on file  Tobacco Use   Smoking status: Every Day    Current packs/day: 1.00    Average packs/day: 1 pack/day for 40.0 years (40.0 ttl pk-yrs)    Types: Cigarettes   Smokeless tobacco: Not on file  Substance and Sexual Activity   Alcohol use: Yes    Alcohol/week: 6.0 standard drinks of alcohol    Types: 6 Standard drinks or equivalent per week   Drug use: No   Sexual activity: Not on file  Other Topics Concern   Not on file  Social History Narrative   Not on file   Social Drivers of Health   Financial Resource Strain: Low Risk  (12/10/2023)   Overall Financial Resource Strain (CARDIA)    Difficulty of Paying Living Expenses: Not very hard  Food Insecurity: No Food Insecurity (12/10/2023)   Hunger Vital Sign    Worried About Running Out of Food in the Last Year: Never true    Ran Out of Food in the Last Year: Never true  Transportation Needs: No Transportation Needs (12/10/2023)   PRAPARE - Administrator, Civil Service (Medical): No    Lack of Transportation (Non-Medical):  No  Physical Activity: Unknown (12/10/2023)   Exercise Vital Sign    Days of Exercise per Week: 0 days    Minutes of Exercise per Session: Not on file  Stress: Stress Concern Present (12/10/2023)   [employer] of Occupational Health - Occupational Stress Questionnaire    Feeling of Stress : Very much  Social Connections: Socially Isolated (12/10/2023)   Social Connection and Isolation Panel [NHANES]    Frequency of Communication with Friends and Family: Once a week    Frequency of Social Gatherings with Friends and Family: Once a week    Attends Religious Services: Never    Database administrator or Organizations: No    Attends Engineer, structural: Not on file    Marital Status: Widowed  Intimate Partner Violence: Not on file     Constitutional: Denies fever,  malaise, fatigue, headache or abrupt weight changes.  HEENT: Denies eye pain, eye redness, ear pain, ringing in the ears, wax buildup, runny nose, nasal congestion, bloody nose, or sore throat. Respiratory: Pt reports chronic cough. Denies difficulty breathing, shortness of breath, or sputum production.   Cardiovascular: Denies chest pain, chest tightness, palpitations or swelling in the hands or feet.  Gastrointestinal: Denies abdominal pain, bloating, constipation, diarrhea or blood in the stool.  GU: Denies urgency, frequency, pain with urination, burning sensation, blood in urine, odor or discharge. Musculoskeletal: Pt reports knee pain. Denies decrease in range of motion, difficulty with gait, muscle pain or joint swelling.  Skin: Denies redness, rashes, lesions or ulcercations.  Neurological: Pt reports tremor. Denies dizziness, difficulty with memory, difficulty with speech or problems with balance and coordination.  Psych: Pt has a history of anxiety. Denies depression, SI/HI.  No other specific complaints in a complete review of systems (except as listed in HPI above).      Objective:   Physical Exam  BP 138/88 (BP Location: Left Arm, Patient Position: Sitting, Cuff Size: Normal)   Ht 5' 7.5" (1.715 m)   Wt 167 lb 9.6 oz (76 kg)   BMI 25.86 kg/m   Wt Readings from Last 3 Encounters:  05/22/22 164 lb 6.4 oz (74.6 kg)  05/22/22 164 lb 6.4 oz (74.6 kg)  01/29/22 165 lb 1.6 oz (74.9 kg)    General: Appears her stated age, overweight, in NAD. Skin: Warm, dry and intact.  HEENT: Head: normal shape and size; Eyes: sclera white, no icterus, conjunctiva pink, PERRLA and EOMs intact;  Cardiovascular: Tachycardic with normal rhythm. S1,S2 noted.  No murmur, rubs or gallops noted. No JVD or BLE edema. No carotid bruits noted. Pulmonary/Chest: Normal effort and positive vesicular breath sounds. No respiratory distress. No wheezes, rales or ronchi noted.  Abdomen: Soft and nontender.  Normal bowel sounds.  Musculoskeletal: No difficulty with gait.  Neurological: Alert and oriented.  Resting head tremor noted.  Coordination normal.  Psychiatric: Mood and affect normal.  Mildly anxious appearing. Judgment and thought content normal.    BMET    Component Value Date/Time   NA 134 (L) 05/22/2022 1055   NA 137 05/22/2015 0000   K 4.7 05/22/2022 1055   CL 100 05/22/2022 1055   CO2 20 05/22/2022 1055   GLUCOSE 83 05/22/2022 1055   BUN 9 05/22/2022 1055   BUN 17 05/22/2015 0000   CREATININE 0.79 05/22/2022 1055   CALCIUM 9.9 05/22/2022 1055   GFRNONAA >60 01/10/2018 1118   GFRAA >60 01/10/2018 1118    Lipid Panel     Component Value  Date/Time   CHOL 192 10/17/2021 1157   CHOL 178 12/12/2015 1928   TRIG 88 10/17/2021 1157   HDL 76 10/17/2021 1157   HDL 56 12/12/2015 1928   CHOLHDL 2.5 10/17/2021 1157   LDLCALC 98 10/17/2021 1157    CBC    Component Value Date/Time   WBC 9.7 05/22/2022 1055   RBC 4.62 05/22/2022 1055   HGB 15.0 05/22/2022 1055   HGB 14.1 12/12/2015 1928   HCT 43.6 05/22/2022 1055   HCT 42.1 12/12/2015 1928   PLT 405 (H) 05/22/2022 1055   PLT 388 (H) 12/12/2015 1928   MCV 94.4 05/22/2022 1055   MCV 93 12/12/2015 1928   MCH 32.5 05/22/2022 1055   MCHC 34.4 05/22/2022 1055   RDW 11.8 05/22/2022 1055   RDW 13.6 12/12/2015 1928   LYMPHSABS 2,842 05/22/2022 1055   LYMPHSABS 3.5 (H) 12/12/2015 1928   MONOABS 0.8 01/10/2018 1118   EOSABS 58 05/22/2022 1055   EOSABS 0.1 12/12/2015 1928   BASOSABS 107 05/22/2022 1055   BASOSABS 0.1 12/12/2015 1928    Hgb A1C Lab Results  Component Value Date   HGBA1C 5.3 10/17/2021            Assessment & Plan:    RTC in 6 months for your annual exam Helayne Lo, NP

## 2023-12-14 NOTE — Assessment & Plan Note (Signed)
Encourage smoking cessation Continue albuterol as needed 

## 2023-12-14 NOTE — Assessment & Plan Note (Signed)
 Encouraged reduction or abstinence

## 2023-12-15 ENCOUNTER — Encounter: Payer: Self-pay | Admitting: Internal Medicine

## 2023-12-15 LAB — LIPID PANEL
Cholesterol: 187 mg/dL (ref <200)
HDL: 77 mg/dL (ref >=50)
LDL Cholesterol (Calc): 93 mg/dL
Non-HDL Cholesterol (Calc): 110 mg/dL (ref <130)
Total CHOL/HDL Ratio: 2.4 (calc) (ref <5.0)
Triglycerides: 79 mg/dL (ref <150)

## 2023-12-15 LAB — COMPREHENSIVE METABOLIC PANEL WITH GFR
AG Ratio: 1.6 (calc) (ref 1.0–2.5)
ALT: 16 U/L (ref 6–29)
AST: 18 U/L (ref 10–35)
Albumin: 4.6 g/dL (ref 3.6–5.1)
Alkaline phosphatase (APISO): 69 U/L (ref 37–153)
BUN: 11 mg/dL (ref 7–25)
CO2: 28 mmol/L (ref 20–32)
Calcium: 9.8 mg/dL (ref 8.6–10.4)
Chloride: 96 mmol/L — ABNORMAL LOW (ref 98–110)
Creat: 0.77 mg/dL (ref 0.50–1.05)
Globulin: 2.9 g/dL (ref 1.9–3.7)
Glucose, Bld: 114 mg/dL — ABNORMAL HIGH (ref 65–99)
Potassium: 4.8 mmol/L (ref 3.5–5.3)
Sodium: 133 mmol/L — ABNORMAL LOW (ref 135–146)
Total Bilirubin: 0.3 mg/dL (ref 0.2–1.2)
Total Protein: 7.5 g/dL (ref 6.1–8.1)
eGFR: 85 mL/min/{1.73_m2} (ref >=60)

## 2023-12-15 LAB — CBC
HCT: 43.1 % (ref 35.0–45.0)
Hemoglobin: 14.5 g/dL (ref 11.7–15.5)
MCH: 31.5 pg (ref 27.0–33.0)
MCHC: 33.6 g/dL (ref 32.0–36.0)
MCV: 93.5 fL (ref 80.0–100.0)
MPV: 10.3 fL (ref 7.5–12.5)
Platelets: 431 10*3/uL — ABNORMAL HIGH (ref 140–400)
RBC: 4.61 10*6/uL (ref 3.80–5.10)
RDW: 12.2 % (ref 11.0–15.0)
WBC: 10.6 10*3/uL (ref 3.8–10.8)

## 2023-12-15 LAB — HEMOGLOBIN A1C
Hgb A1c MFr Bld: 5.6 % (ref <5.7)
Mean Plasma Glucose: 114 mg/dL
eAG (mmol/L): 6.3 mmol/L

## 2024-06-16 ENCOUNTER — Encounter: Payer: Self-pay | Admitting: Internal Medicine

## 2024-06-16 ENCOUNTER — Ambulatory Visit (INDEPENDENT_AMBULATORY_CARE_PROVIDER_SITE_OTHER): Payer: Medicare (Managed Care) | Admitting: Internal Medicine

## 2024-06-16 VITALS — BP 134/88 | Ht 67.5 in | Wt 162.0 lb

## 2024-06-16 DIAGNOSIS — Z1231 Encounter for screening mammogram for malignant neoplasm of breast: Secondary | ICD-10-CM | POA: Diagnosis not present

## 2024-06-16 DIAGNOSIS — Z0001 Encounter for general adult medical examination with abnormal findings: Secondary | ICD-10-CM

## 2024-06-16 DIAGNOSIS — R6889 Other general symptoms and signs: Secondary | ICD-10-CM

## 2024-06-16 DIAGNOSIS — Z136 Encounter for screening for cardiovascular disorders: Secondary | ICD-10-CM | POA: Diagnosis not present

## 2024-06-16 DIAGNOSIS — E663 Overweight: Secondary | ICD-10-CM | POA: Diagnosis not present

## 2024-06-16 DIAGNOSIS — R739 Hyperglycemia, unspecified: Secondary | ICD-10-CM | POA: Diagnosis not present

## 2024-06-16 DIAGNOSIS — Z6825 Body mass index (BMI) 25.0-25.9, adult: Secondary | ICD-10-CM | POA: Diagnosis not present

## 2024-06-16 DIAGNOSIS — Z780 Asymptomatic menopausal state: Secondary | ICD-10-CM

## 2024-06-16 DIAGNOSIS — Z122 Encounter for screening for malignant neoplasm of respiratory organs: Secondary | ICD-10-CM | POA: Diagnosis not present

## 2024-06-16 NOTE — Progress Notes (Signed)
 Subjective:    Patient ID: Sarah Weber, female    DOB: 1956/05/09, 68 y.o.   MRN: [id number]  HPI  Patient presents to clinic today for her annual exam.  Flu: Never Tetanus: > 10 years ago COVID: Never Pneumovax: Never Prevnar: Never Shingrix: Never Pap smear: > 5 years ago Mammogram: 12/2017 Bone density: Never Lung cancer screening: Never Colon screening: 11/2020, Cologuard Vision screening: as needed Dentist: as needed  Diet: She does eat meat. She consumes more veggies than fruit. She tries to avoid fried foods. She drinks mostly coffee, water, green tea, beers Exercise: None    Review of Systems   Past Medical History:  Diagnosis Date   Anxiety    Asthma    BCC (basal cell carcinoma)    Diverticulosis    Thyroid  disease    Urinary incontinence     Current Outpatient Medications  Medication Sig Dispense Refill   acetaminophen (TYLENOL) 500 MG tablet Take 500 mg by mouth every 6 (six) hours as needed.     albuterol  (VENTOLIN  HFA) 108 (90 Base) MCG/ACT inhaler Inhale 2 puffs into the lungs every 6 (six) hours as needed for wheezing or shortness of breath. 1 each 11   diazepam  (VALIUM ) 5 MG tablet 1 tab p.o. 1 hour prior to dental appointment 5 tablet 0   esomeprazole (NEXIUM) 20 MG capsule Take 20 mg by mouth daily at 12 noon.     loratadine (CLARITIN) 10 MG tablet Take 10 mg by mouth daily.     magnesium citrate SOLN Take 1 Bottle by mouth once.     Misc Natural Products (OSTEO BI-FLEX ADV JOINT SHIELD PO)      Multiple Vitamin (MULTIVITAMIN) capsule Take 1 capsule by mouth daily.     No current facility-administered medications for this visit.    Allergies  Allergen Reactions   Amoxil [Amoxicillin] Itching    Mouth itching   Erythromycin     Upset Stomach    Family History  Problem Relation Age of Onset   Parkinson's disease Mother    Kidney disease Mother    Hypertension Mother    Cancer Father        Pancreatic   Diabetes Sister     Diabetes Sister    Diabetes Maternal Grandmother    Diabetes Paternal Grandmother    Breast cancer Paternal Grandmother 70    Social History   Socioeconomic History   Marital status: Single    Spouse name: Not on file   Number of children: Not on file   Years of education: Not on file   Highest education level: Some college, no degree  Occupational History   Not on file  Tobacco Use   Smoking status: Every Day    Current packs/day: 1.00    Average packs/day: 1 pack/day for 40.0 years (40.0 ttl pk-yrs)    Types: Cigarettes   Smokeless tobacco: Not on file  Substance and Sexual Activity   Alcohol use: Yes    Alcohol/week: 9.0 standard drinks of alcohol    Types: 3 Cans of beer, 6 Standard drinks or equivalent per week    Comment: 2-3 beers per day   Drug use: No   Sexual activity: Not on file  Other Topics Concern   Not on file  Social History Narrative   Not on file   Social Drivers of Health   Financial Resource Strain: Low Risk  (12/10/2023)   Overall Financial Resource Strain (CARDIA)    Difficulty  of Paying Living Expenses: Not very hard  Food Insecurity: No Food Insecurity (12/10/2023)   Hunger Vital Sign    Worried About Running Out of Food in the Last Year: Never true    Ran Out of Food in the Last Year: Never true  Transportation Needs: No Transportation Needs (12/10/2023)   PRAPARE - Administrator, Civil Service (Medical): No    Lack of Transportation (Non-Medical): No  Physical Activity: Unknown (12/10/2023)   Exercise Vital Sign    Days of Exercise per Week: 0 days    Minutes of Exercise per Session: Not on file  Stress: Stress Concern Present (12/10/2023)   [employer] of Occupational Health - Occupational Stress Questionnaire    Feeling of Stress : Very much  Social Connections: Socially Isolated (12/10/2023)   Social Connection and Isolation Panel    Frequency of Communication with Friends and Family: Once a week    Frequency of Social  Gatherings with Friends and Family: Once a week    Attends Religious Services: Never    Database Administrator or Organizations: No    Attends Engineer, Structural: Not on file    Marital Status: Widowed  Intimate Partner Violence: Not on file     Constitutional: Denies fever, malaise, fatigue, headache or abrupt weight changes.  HEENT: Denies eye pain, eye redness, ear pain, ringing in the ears, wax buildup, runny nose, nasal congestion, bloody nose, or sore throat. Respiratory: Denies difficulty breathing, shortness of breath, cough or sputum production.   Cardiovascular: Denies chest pain, chest tightness, palpitations or swelling in the hands or feet.  Gastrointestinal: Denies abdominal pain, bloating, constipation, diarrhea or blood in the stool.  GU: Denies urgency, frequency, pain with urination, burning sensation, blood in urine, odor or discharge. Musculoskeletal: Patient reports joint pain.  Denies decrease in range of motion, difficulty with gait, muscle pain or joint swelling.  Skin: Patient reports cold intolerance.  Denies redness, rashes, lesions or ulcercations.  Neurological: Patient reports tremor.  Denies dizziness, difficulty with memory, difficulty with speech or problems with balance and coordination.  Psych: Patient has a history of anxiety.  Denies depression, SI/HI.  No other specific complaints in a complete review of systems (except as listed in HPI above).      Objective:   Physical Exam  BP 134/88 (BP Location: Left Arm, Patient Position: Sitting, Cuff Size: Normal)   Ht 5' 7.5 (1.715 m)   Wt 162 lb (73.5 kg)   BMI 25.00 kg/m   Wt Readings from Last 3 Encounters:  12/14/23 167 lb 9.6 oz (76 kg)  05/22/22 164 lb 6.4 oz (74.6 kg)  05/22/22 164 lb 6.4 oz (74.6 kg)    General: Appears her stated age, overweight, in NAD. Skin: Warm, dry and intact.  PAD changes noted of fingers without ulceration. HEENT: Head: normal shape and size; Eyes:  sclera white, no icterus, conjunctiva pink, PERRLA and EOMs intact; Neck:  Neck supple, trachea midline. No masses, lumps or thyromegaly present.  Cardiovascular: Normal rate and rhythm. S1,S2 noted.  No murmur, rubs or gallops noted. No JVD or BLE edema. No carotid bruits noted. Pulmonary/Chest: Normal effort and positive vesicular breath sounds. No respiratory distress. No wheezes, rales or ronchi noted.  Abdomen: Soft and nontender. Normal bowel sounds.  Musculoskeletal: Strength 5/5 BUE/BLE.  No difficulty with gait.  Neurological: Alert and oriented. Cranial nerves II-XII grossly intact. Coordination normal.  Psychiatric: Mood and affect normal. Behavior is normal. Judgment and  thought content normal.    BMET    Component Value Date/Time   NA 133 (L) 12/14/2023 1018   NA 137 05/22/2015 0000   K 4.8 12/14/2023 1018   CL 96 (L) 12/14/2023 1018   CO2 28 12/14/2023 1018   GLUCOSE 114 (H) 12/14/2023 1018   BUN 11 12/14/2023 1018   BUN 17 05/22/2015 0000   CREATININE 0.77 12/14/2023 1018   CALCIUM 9.8 12/14/2023 1018   GFRNONAA >60 01/10/2018 1118   GFRAA >60 01/10/2018 1118    Lipid Panel     Component Value Date/Time   CHOL 187 12/14/2023 1018   CHOL 178 12/12/2015 1928   TRIG 79 12/14/2023 1018   HDL 77 12/14/2023 1018   HDL 56 12/12/2015 1928   CHOLHDL 2.4 12/14/2023 1018   LDLCALC 93 12/14/2023 1018    CBC    Component Value Date/Time   WBC 10.6 12/14/2023 1018   RBC 4.61 12/14/2023 1018   HGB 14.5 12/14/2023 1018   HGB 14.1 12/12/2015 1928   HCT 43.1 12/14/2023 1018   HCT 42.1 12/12/2015 1928   PLT 431 (H) 12/14/2023 1018   PLT 388 (H) 12/12/2015 1928   MCV 93.5 12/14/2023 1018   MCV 93 12/12/2015 1928   MCH 31.5 12/14/2023 1018   MCHC 33.6 12/14/2023 1018   RDW 12.2 12/14/2023 1018   RDW 13.6 12/12/2015 1928   LYMPHSABS 2,842 05/22/2022 1055   LYMPHSABS 3.5 (H) 12/12/2015 1928   MONOABS 0.8 01/10/2018 1118   EOSABS 58 05/22/2022 1055   EOSABS 0.1  12/12/2015 1928   BASOSABS 107 05/22/2022 1055   BASOSABS 0.1 12/12/2015 1928    Hgb A1C Lab Results  Component Value Date   HGBA1C 5.6 12/14/2023            Assessment & Plan:   Preventative health maintenance:  Flu shot declined She declines tetanus for financial reasons, advised her if she gets better cut to go get this done Encouraged her to get her COVID vaccine Prevnar 20 declined Discussed Shingrix vaccine, she will check coverage with her insurance company and schedule visit if she would like to have this done She no longer needs to screen for cervical cancer Mammogram and bone density ordered-she will call to schedule She declines referral to GI for colonoscopy or Cologuard at this time Lung cancer screening ordered Encouraged her to consume a balanced diet and exercise regimen Advised her to see an eye doctor and dentist annually We will check CBC, c-Met, lipid, A1c today  Cold intolerance:  Will check TSH today  RTC in 6 months for follow-up of chronic conditions [name], NP

## 2024-06-16 NOTE — Patient Instructions (Signed)
 Health Maintenance for Postmenopausal Women Menopause is a normal process in which your ability to get pregnant comes to an end. This process happens slowly over many months or years, usually between the ages of 76 and 38. Menopause is complete when you have missed your menstrual period for 12 months. It is important to talk with your health care provider about some of the most common conditions that affect women after menopause (postmenopausal women). These include heart disease, cancer, and bone loss (osteoporosis). Adopting a healthy lifestyle and getting preventive care can help to promote your health and wellness. The actions you take can also lower your chances of developing some of these common conditions. What are the signs and symptoms of menopause? During menopause, you may have the following symptoms: Hot flashes. These can be moderate or severe. Night sweats. Decrease in sex drive. Mood swings. Headaches. Tiredness (fatigue). Irritability. Memory problems. Problems falling asleep or staying asleep. Talk with your health care provider about treatment options for your symptoms. Do I need hormone replacement therapy? Hormone replacement therapy is effective in treating symptoms that are caused by menopause, such as hot flashes and night sweats. Hormone replacement carries certain risks, especially as you become older. If you are thinking about using estrogen or estrogen with progestin, discuss the benefits and risks with your health care provider. How can I reduce my risk for heart disease and stroke? The risk of heart disease, heart attack, and stroke increases as you age. One of the causes may be a change in the body's hormones during menopause. This can affect how your body uses dietary fats, triglycerides, and cholesterol. Heart attack and stroke are medical emergencies. There are many things that you can do to help prevent heart disease and stroke. Watch your blood pressure High  blood pressure causes heart disease and increases the risk of stroke. This is more likely to develop in people who have high blood pressure readings or are overweight. Have your blood pressure checked: Every 3-5 years if you are 32-23 years of age. Every year if you are 31 years old or older. Eat a healthy diet  Eat a diet that includes plenty of vegetables, fruits, low-fat dairy products, and lean protein. Do not eat a lot of foods that are high in solid fats, added sugars, or sodium. Get regular exercise Get regular exercise. This is one of the most important things you can do for your health. Most adults should: Try to exercise for at least 150 minutes each week. The exercise should increase your heart rate and make you sweat (moderate-intensity exercise). Try to do strengthening exercises at least twice each week. Do these in addition to the moderate-intensity exercise. Spend less time sitting. Even light physical activity can be beneficial. Other tips Work with your health care provider to achieve or maintain a healthy weight. Do not use any products that contain nicotine or tobacco. These products include cigarettes, chewing tobacco, and vaping devices, such as e-cigarettes. If you need help quitting, ask your health care provider. Know your numbers. Ask your health care provider to check your cholesterol and your blood sugar (glucose). Continue to have your blood tested as directed by your health care provider. Do I need screening for cancer? Depending on your health history and family history, you may need to have cancer screenings at different stages of your life. This may include screening for: Breast cancer. Cervical cancer. Lung cancer. Colorectal cancer. What is my risk for osteoporosis? After menopause, you may be  at increased risk for osteoporosis. Osteoporosis is a condition in which bone destruction happens more quickly than new bone creation. To help prevent osteoporosis or  the bone fractures that can happen because of osteoporosis, you may take the following actions: If you are 24-54 years old, get at least 1,000 mg of calcium and at least 600 international units (IU) of vitamin D  per day. If you are older than age 75 but younger than age 30, get at least 1,200 mg of calcium and at least 600 international units (IU) of vitamin D  per day. If you are older than age 8, get at least 1,200 mg of calcium and at least 800 international units (IU) of vitamin D  per day. Smoking and drinking excessive alcohol increase the risk of osteoporosis. Eat foods that are rich in calcium and vitamin D , and do weight-bearing exercises several times each week as directed by your health care provider. How does menopause affect my mental health? Depression may occur at any age, but it is more common as you become older. Common symptoms of depression include: Feeling depressed. Changes in sleep patterns. Changes in appetite or eating patterns. Feeling an overall lack of motivation or enjoyment of activities that you previously enjoyed. Frequent crying spells. Talk with your health care provider if you think that you are experiencing any of these symptoms. General instructions See your health care provider for regular wellness exams and vaccines. This may include: Scheduling regular health, dental, and eye exams. Getting and maintaining your vaccines. These include: Influenza vaccine. Get this vaccine each year before the flu season begins. Pneumonia vaccine. Shingles vaccine. Tetanus, diphtheria, and pertussis (Tdap) booster vaccine. Your health care provider may also recommend other immunizations. Tell your health care provider if you have ever been abused or do not feel safe at home. Summary Menopause is a normal process in which your ability to get pregnant comes to an end. This condition causes hot flashes, night sweats, decreased interest in sex, mood swings, headaches, or lack  of sleep. Treatment for this condition may include hormone replacement therapy. Take actions to keep yourself healthy, including exercising regularly, eating a healthy diet, watching your weight, and checking your blood pressure and blood sugar levels. Get screened for cancer and depression. Make sure that you are up to date with all your vaccines. This information is not intended to replace advice given to you by your health care provider. Make sure you discuss any questions you have with your health care provider. Document Revised: 12/23/2020 Document Reviewed: 12/23/2020 Elsevier Patient Education  2024 [employer].

## 2024-06-16 NOTE — Assessment & Plan Note (Signed)
 Encouraged diet and exercise for weight loss ?

## 2024-06-17 LAB — COMPREHENSIVE METABOLIC PANEL WITH GFR
AG Ratio: 1.6 (calc) (ref 1.0–2.5)
ALT: 21 U/L (ref 6–29)
AST: 19 U/L (ref 10–35)
Albumin: 4.7 g/dL (ref 3.6–5.1)
Alkaline phosphatase (APISO): 71 U/L (ref 37–153)
BUN: 8 mg/dL (ref 7–25)
CO2: 24 mmol/L (ref 20–32)
Calcium: 9.9 mg/dL (ref 8.6–10.4)
Chloride: 96 mmol/L — ABNORMAL LOW (ref 98–110)
Creat: 0.73 mg/dL (ref 0.50–1.05)
Globulin: 3 g/dL (ref 1.9–3.7)
Glucose, Bld: 128 mg/dL — ABNORMAL HIGH (ref 65–99)
Potassium: 4.6 mmol/L (ref 3.5–5.3)
Sodium: 131 mmol/L — ABNORMAL LOW (ref 135–146)
Total Bilirubin: 0.4 mg/dL (ref 0.2–1.2)
Total Protein: 7.7 g/dL (ref 6.1–8.1)
eGFR: 90 mL/min/1.73m2 (ref >=60)

## 2024-06-17 LAB — HEMOGLOBIN A1C
Hgb A1c MFr Bld: 5.3 % (ref <5.7)
Mean Plasma Glucose: 105 mg/dL
eAG (mmol/L): 5.8 mmol/L

## 2024-06-17 LAB — CBC
HCT: 44.4 % (ref 35.0–45.0)
Hemoglobin: 14.9 g/dL (ref 11.7–15.5)
MCH: 32 pg (ref 27.0–33.0)
MCHC: 33.6 g/dL (ref 32.0–36.0)
MCV: 95.5 fL (ref 80.0–100.0)
MPV: 10.8 fL (ref 7.5–12.5)
Platelets: 417 Thousand/uL — ABNORMAL HIGH (ref 140–400)
RBC: 4.65 Million/uL (ref 3.80–5.10)
RDW: 12.6 % (ref 11.0–15.0)
WBC: 10.7 Thousand/uL (ref 3.8–10.8)

## 2024-06-17 LAB — TSH: TSH: 2.7 m[IU]/L (ref 0.40–4.50)

## 2024-06-17 LAB — LIPID PANEL
Cholesterol: 198 mg/dL (ref <200)
HDL: 92 mg/dL (ref >=50)
LDL Cholesterol (Calc): 90 mg/dL
Non-HDL Cholesterol (Calc): 106 mg/dL (ref <130)
Total CHOL/HDL Ratio: 2.2 (calc) (ref <5.0)
Triglycerides: 69 mg/dL (ref <150)

## 2024-06-19 ENCOUNTER — Ambulatory Visit: Payer: Self-pay | Admitting: Internal Medicine

## 2024-07-04 ENCOUNTER — Telehealth: Payer: Self-pay

## 2024-07-04 NOTE — Telephone Encounter (Signed)
 Copied from CRM [id number]. Topic: General - Other >> Jul 04, 2024  3:40 PM Pinkey ORN wrote: Patient states she received a letter to attend jury duty and is needing a medical excuse so that she doesn't have to attend. Patient wants to know if she needs to schedule an office visit in order to get that, please follow up with patient.

## 2024-07-05 NOTE — Telephone Encounter (Signed)
 She can make an appointment to discuss if she would like however upon reviewing her chart, I do not see that she has any medical problems that would prevent her from participating in jury duty

## 2024-07-05 NOTE — Telephone Encounter (Signed)
 Spoke with patient, she is scheduled, she can do the virtual anytime during the day

## 2024-07-06 ENCOUNTER — Telehealth (INDEPENDENT_AMBULATORY_CARE_PROVIDER_SITE_OTHER): Payer: Medicare (Managed Care) | Admitting: Internal Medicine

## 2024-07-06 ENCOUNTER — Encounter: Payer: Self-pay | Admitting: Internal Medicine

## 2024-07-06 DIAGNOSIS — R251 Tremor, unspecified: Secondary | ICD-10-CM

## 2024-07-06 DIAGNOSIS — J4520 Mild intermittent asthma, uncomplicated: Secondary | ICD-10-CM

## 2024-07-06 DIAGNOSIS — F418 Other specified anxiety disorders: Secondary | ICD-10-CM

## 2024-07-06 NOTE — Progress Notes (Signed)
 Virtual Visit via Video Note  I connected with Sarah Weber on 07/06/24 at  3:40 PM EST by a video enabled telemedicine application and verified that I am speaking with the correct person using two identifiers.  Location: Patient: Home Provider: Office  Person's participating in this video call: Sarah Laura, NP-C and [employer]   I discussed the limitations of evaluation and management by telemedicine and the availability of in person appointments. The patient expressed understanding and agreed to proceed.  History of Present Illness:   Discussed the use of AI scribe software for clinical note transcription with the patient, who gave verbal consent to proceed.   Sarah Weber is a 68 year old female with asthma, essential tremor and situational anxiety who presents requesting an excuse for jury duty due to asthma.  She uses albuterol  as needed for her asthma. She is concerned about her ability to serve on a jury due to her asthma.  She reports intermittent coughing spells that can lead to urinary stress incontinence.  She uses albuterol  only as needed.  She is not on a daily maintenance inhaler.  She denies chronic cough or shortness of breath.  She does continue to smoke.      Past Medical History:  Diagnosis Date   Anxiety    Asthma    BCC (basal cell carcinoma)    Diverticulosis    Thyroid  disease    Urinary incontinence     Current Outpatient Medications  Medication Sig Dispense Refill   acetaminophen (TYLENOL) 500 MG tablet Take 500 mg by mouth every 6 (six) hours as needed.     albuterol  (VENTOLIN  HFA) 108 (90 Base) MCG/ACT inhaler Inhale 2 puffs into the lungs every 6 (six) hours as needed for wheezing or shortness of breath. 1 each 11   diazepam  (VALIUM ) 5 MG tablet 1 tab p.o. 1 hour prior to dental appointment 5 tablet 0   esomeprazole (NEXIUM) 20 MG capsule Take 20 mg by mouth daily at 12 noon.     loratadine (CLARITIN) 10 MG tablet Take 10 mg by mouth daily.      magnesium citrate SOLN Take 1 Bottle by mouth once.     Misc Natural Products (OSTEO BI-FLEX ADV JOINT SHIELD PO)      Multiple Vitamin (MULTIVITAMIN) capsule Take 1 capsule by mouth daily.     No current facility-administered medications for this visit.    Allergies  Allergen Reactions   Amoxil [Amoxicillin] Itching    Mouth itching   Erythromycin     Upset Stomach    Family History  Problem Relation Age of Onset   Parkinson's disease Mother    Kidney disease Mother    Hypertension Mother    Cancer Father        Pancreatic   Diabetes Sister    Diabetes Sister    Diabetes Maternal Grandmother    Diabetes Paternal Grandmother    Breast cancer Paternal Grandmother 74    Social History   Socioeconomic History   Marital status: Single    Spouse name: Not on file   Number of children: Not on file   Years of education: Not on file   Highest education level: Some college, no degree  Occupational History   Not on file  Tobacco Use   Smoking status: Every Day    Current packs/day: 1.00    Average packs/day: 1 pack/day for 40.0 years (40.0 ttl pk-yrs)    Types: Cigarettes   Smokeless tobacco:  Not on file  Substance and Sexual Activity   Alcohol use: Yes    Alcohol/week: 9.0 standard drinks of alcohol    Types: 3 Cans of beer, 6 Standard drinks or equivalent per week    Comment: 2-3 beers per day   Drug use: No   Sexual activity: Not on file  Other Topics Concern   Not on file  Social History Narrative   Not on file   Social Drivers of Health   Financial Resource Strain: Low Risk  (12/10/2023)   Overall Financial Resource Strain (CARDIA)    Difficulty of Paying Living Expenses: Not very hard  Food Insecurity: No Food Insecurity (12/10/2023)   Hunger Vital Sign    Worried About Running Out of Food in the Last Year: Never true    Ran Out of Food in the Last Year: Never true  Transportation Needs: No Transportation Needs (12/10/2023)   PRAPARE - Therapist, Art (Medical): No    Lack of Transportation (Non-Medical): No  Physical Activity: Unknown (12/10/2023)   Exercise Vital Sign    Days of Exercise per Week: 0 days    Minutes of Exercise per Session: Not on file  Stress: Stress Concern Present (12/10/2023)   [employer] of Occupational Health - Occupational Stress Questionnaire    Feeling of Stress : Very much  Social Connections: Socially Isolated (12/10/2023)   Social Connection and Isolation Panel    Frequency of Communication with Friends and Family: Once a week    Frequency of Social Gatherings with Friends and Family: Once a week    Attends Religious Services: Never    Database Administrator or Organizations: No    Attends Engineer, Structural: Not on file    Marital Status: Widowed  Intimate Partner Violence: Not on file     Constitutional: Denies fever, malaise, fatigue, headache or abrupt weight changes.  HEENT: Denies eye pain, eye redness, ear pain, ringing in the ears, wax buildup, runny nose, nasal congestion, bloody nose, or sore throat. Respiratory: Patient reports coughing spells.  Denies difficulty breathing, shortness of breath, or sputum production.   Cardiovascular: Denies chest pain, chest tightness, palpitations or swelling in the hands or feet.  Gastrointestinal: Denies abdominal pain, bloating, constipation, diarrhea or blood in the stool.  GU: Denies urgency, frequency, pain with urination, burning sensation, blood in urine, odor or discharge. Musculoskeletal: Denies decrease in range of motion, difficulty with gait, muscle pain or joint pain and swelling.  Skin: Denies redness, rashes, lesions or ulcercations.  Neurological: Patient reports tremor.  Denies dizziness, difficulty with memory, difficulty with speech or problems with balance and coordination.  Psych: Patient has a history of situational anxiety.  Denies depression, SI/HI.  No other specific complaints in a complete  review of systems (except as listed in HPI above).  Observations/Objective:  Wt Readings from Last 3 Encounters:  06/16/24 162 lb (73.5 kg)  12/14/23 167 lb 9.6 oz (76 kg)  05/22/22 164 lb 6.4 oz (74.6 kg)    General: Appears her stated age, well developed, well nourished in NAD. Pulmonary/Chest: Normal effort.  Intermittent dry cough noted.  No respiratory distress. Neurological: Alert and oriented.   BMET    Component Value Date/Time   NA 131 (L) 06/16/2024 1024   NA 137 05/22/2015 0000   K 4.6 06/16/2024 1024   CL 96 (L) 06/16/2024 1024   CO2 24 06/16/2024 1024   GLUCOSE 128 (H) 06/16/2024 1024  BUN 8 06/16/2024 1024   BUN 17 05/22/2015 0000   CREATININE 0.73 06/16/2024 1024   CALCIUM 9.9 06/16/2024 1024   GFRNONAA >60 01/10/2018 1118   GFRAA >60 01/10/2018 1118    Lipid Panel     Component Value Date/Time   CHOL 198 06/16/2024 1024   CHOL 178 12/12/2015 1928   TRIG 69 06/16/2024 1024   HDL 92 06/16/2024 1024   HDL 56 12/12/2015 1928   CHOLHDL 2.2 06/16/2024 1024   LDLCALC 90 06/16/2024 1024    CBC    Component Value Date/Time   WBC 10.7 06/16/2024 1024   RBC 4.65 06/16/2024 1024   HGB 14.9 06/16/2024 1024   HGB 14.1 12/12/2015 1928   HCT 44.4 06/16/2024 1024   HCT 42.1 12/12/2015 1928   PLT 417 (H) 06/16/2024 1024   PLT 388 (H) 12/12/2015 1928   MCV 95.5 06/16/2024 1024   MCV 93 12/12/2015 1928   MCH 32.0 06/16/2024 1024   MCHC 33.6 06/16/2024 1024   RDW 12.6 06/16/2024 1024   RDW 13.6 12/12/2015 1928   LYMPHSABS 2,842 05/22/2022 1055   LYMPHSABS 3.5 (H) 12/12/2015 1928   MONOABS 0.8 01/10/2018 1118   EOSABS 58 05/22/2022 1055   EOSABS 0.1 12/12/2015 1928   BASOSABS 107 05/22/2022 1055   BASOSABS 0.1 12/12/2015 1928    Hgb A1C Lab Results  Component Value Date   HGBA1C 5.3 06/16/2024       Assessment and Plan:  Assessment and Plan    Asthma, mild intermittent Asthma managed with as-needed albuterol . No maintenance inhaler  required. No oxygen needed. - Continue albuterol  as needed.  -Encouraged smoking cessation -Advised her that this is not a reason not to serve on jury duty  and perform her significant duty    Tremor Not medicated, does not interfere with ADLs - Advised her that this is not a reason not to serve on jury duty and perform her civic duty  Situational anxiety Not medicated, not debilitating- -Advised her that this is not a reason not to serve on jury duty and perform her COVID civic duty  RTC in 5 months for follow-up of chronic conditions Follow Up Instructions:    I discussed the assessment and treatment plan with the patient. The patient was provided an opportunity to ask questions and all were answered. The patient agreed with the plan and demonstrated an understanding of the instructions.   The patient was advised to call back or seek an in-person evaluation if the symptoms worsen or if the condition fails to improve as anticipated.   Sarah Laura, NP

## 2024-07-22 DIAGNOSIS — K047 Periapical abscess without sinus: Secondary | ICD-10-CM | POA: Diagnosis not present

## 2024-12-15 ENCOUNTER — Ambulatory Visit: Payer: Medicare (Managed Care) | Admitting: Internal Medicine
